# Patient Record
Sex: Female | Born: 1980
Health system: Southern US, Community
[De-identification: ages and names within clinical notes are randomized; demographics above are authoritative.]

## PROBLEM LIST (undated history)

## (undated) DIAGNOSIS — D649 Anemia, unspecified: Secondary | ICD-10-CM

## (undated) HISTORY — DX: Anemia, unspecified: D64.9

---

## 2013-08-27 ENCOUNTER — Other Ambulatory Visit (HOSPITAL_COMMUNITY)
Admission: RE | Admit: 2013-08-27 | Discharge: 2013-08-27 | Disposition: A | Discharge status: Home / Self Care | Payer: BC Managed Care – PPO | Source: Ambulatory Visit | Attending: Gynecology | Admitting: Gynecology

## 2013-08-27 ENCOUNTER — Encounter: Payer: Self-pay | Admitting: Gynecology

## 2013-08-27 ENCOUNTER — Ambulatory Visit (INDEPENDENT_AMBULATORY_CARE_PROVIDER_SITE_OTHER): Payer: BC Managed Care – PPO | Admitting: Gynecology

## 2013-08-27 VITALS — BP 120/78 | Ht 62.0 in | Wt 163.4 lb

## 2013-08-27 DIAGNOSIS — Z01419 Encounter for gynecological examination (general) (routine) without abnormal findings: Secondary | ICD-10-CM | POA: Insufficient documentation

## 2013-08-27 DIAGNOSIS — Z309 Encounter for contraceptive management, unspecified: Secondary | ICD-10-CM

## 2013-08-27 DIAGNOSIS — Z1151 Encounter for screening for human papillomavirus (HPV): Secondary | ICD-10-CM | POA: Insufficient documentation

## 2013-08-27 LAB — CBC WITH DIFFERENTIAL/PLATELET
Basophils Relative: 1 % (ref 0–1)
Eosinophils Absolute: 0.1 10*3/uL (ref 0.0–0.7)
Lymphocytes Relative: 23 % (ref 12–46)
Lymphs Abs: 2.4 10*3/uL (ref 0.7–4.0)
MCH: 26.8 pg (ref 26.0–34.0)
MCHC: 33.9 g/dL (ref 30.0–36.0)
Neutrophils Relative %: 67 % (ref 43–77)
Platelets: 394 10*3/uL (ref 150–400)
RBC: 4.66 MIL/uL (ref 3.87–5.11)
WBC: 10.4 10*3/uL (ref 4.0–10.5)

## 2013-08-27 LAB — HEMOGLOBIN A1C: Hgb A1c MFr Bld: 5.7 % — ABNORMAL HIGH (ref <5.7)

## 2013-08-27 LAB — CHOLESTEROL, TOTAL: Cholesterol: 193 mg/dL (ref 0–200)

## 2013-08-27 LAB — TSH: TSH: 1.776 u[IU]/mL (ref 0.350–4.500)

## 2013-08-27 NOTE — Patient Instructions (Addendum)
Informacin sobre el dispositivo intrauterino  (Intrauterine Device Information) El dispositivo intrauterino (DIU) se inserta en el tero e impide el embarazo. Hay dos tipos de DIU:   DIU de cobre. Este tipo de DIU est recubierto con un alambre de cobre y se inserta dentro del tero. El cobre hace que el tero y las trompas de Falopio produzcan un liquido que [employer] espermatozoides. El DIU de cobre puede Geneticist, molecular durante 10 aos.  DIU hormonal. Este tipo de DIU contiene la hormona progestina (progesterona sinttica). La hormona espesa el moco cervical y evita que los espermatozoides ingresen al tero y tambin afina la membrana que cubre el tero para evitar la implantacin del vulo fertilizado. La hormona debilita o destruye los espermatozoides que ingresan al tero. El DIU hormonal puede Geneticist, molecular durante 5 aos. El mdico se asegurar de que usted es una buena candidata para usar el DIU cono anticonceptivo. Hable con su mdico acerca de los posibles efectos secundarios.  VENTAJAS  Es muy eficaz, reversible, de accin prolongada y de bajo mantenimiento.  No hay efectos secundarios relacionados con el estrgeno.  El DIU puede ser utilizado durante la Market researcher.  No est asociado con el aumento de [place].  Funciona inmediatamente despus de la insercin.  El DIU de cobre no interfiere con las hormonas femeninas.  El DIU con progesterona puede hacer que los perodos menstruales no sean tan abundantes.  El DIU de progesterona puede usarse durante 5 aos.  El DIU de cobre puede usarse durante 10 aos. DESVENTAJAS  El DIU de progesterona puede estar asociado con patrones de sangrado irregular.  El DIU de cobre puede hacer que el flujo menstrual ms abundante y doloroso.  Puede experimentar clicos y sangrado vaginal despus de la insercin. Document Released: 05/04/2010 Document Revised: 02/06/2012 [facility] Patient Information 2014 [place],  [place].   Autoexamen de [employer]  Chief Executive Officer) El autoexamen de mamas puede detectar problemas de manera temprana, prevenir complicaciones mdicas significativas y posiblemente salvar su vida. Al hacerlo, podr familiarizarse con el aspecto y forma de sus [place], y observar cambios. Esto le permite descubrir cambios de manera precoz. Este autoexamen [employer] ofrece la tranquilidad de que sus senos estn en buen [place] de [place]. Una forma de aprender qu es normal para sus mamas y si sufren modificaciones es Radio producer un autoexamen.   Si encuentra un bulto o algo que no estaba presente anteriormente, lo mejor es ponerse en contacto con su mdico inmediatamente. Otro hallazgo que debe ser evaluado por su mdico es la secrecin del pezn, especialmente si es con sangre; cambios en la piel o enrojecimiento; reas donde la piel parece estar tironeada (retrada) o nuevos bultos o protuberancias. El dolor en los senos es rara vez se asocia con el cncer (malignidad), pero tambin debe ser evaluado por un mdico.  CMO REALIZAR EL AUTOEXAMEN DE MAMAS  El mejor momento para examinar sus mamas es a los 5 a 7 das despus de finalizado el perodo menstrual. Durante la menstruacin, las mamas estn ms abultadas y puede haber ms dificultad para Clinical research associate modificaciones. Si no menstra, ha llegado a la menopausia, o le han extirpado el tero (histerectomia), usted debe examinar sus senos a intervalos regulares, por ejemplo cada mes. Si est amamantando, examine sus senos despus de alimentar al beb o despus de usar un extractor de [place]. Los implantes mamarios no disminuyen el riesgo de bultos o tumores, por lo que debe seguir realizando el autoexamen de [employer] se recomienda.  Hable con su mdico acerca de cmo determinar la diferencia entre el implante y el tejido [place]. Adems, debe consultar cuanta presin debe hacer durante el examen. Con el tiempo se familiarizar con las variaciones de las mamas y se  sentir ms cmoda para Horticulturist, commercial. Para el autoexamen deber quitarse toda la ropa de la cintura para [place].  1.  Observe sus senos y pezones. Prese frente a un espejo en una habitacin con buena iluminacin. Con las [employer] caderas, presione las manos firmemente [place]. Busque diferencias en la forma, el contorno y el tamao de un pecho al otro (asimetras). Entre las asimetras se incluyen arrugas, depresiones o protuberancias. Tambin, busque cambios en la piel, como reas enrojecidas o escamosas. Busque cambios en los pezones, como secreciones, hoyuelos, cambios en la posicin, o enrojecimiento. 2. [place] cuidadosamente sus senos. Es mucho mejor [employer] en la ducha o en la baera, [place] jabn o cuando est recostada sobre su espalda. Coloque el brazo (en el lado de la mama que se examina) por arriba de la cabeza. Use las yemas (no las puntas) de los tres dedos centrales de la mano opuesta para palpar. Comience en la zona de la axila, haga crculos de  de pulgada (2 cm) y vaya superponindolos. Utilice 3 niveles diferentes de presin (ligero, medio y [place]) en cada crculo antes de pasar al siguiente. Se necesita una presin ligera para sentir los tejidos ms cercanos a la piel. La presin media ayudar a sentir el tejido [employer] un poco ms profundo, mientras que se necesita una presin firme para palpar el tejido que se encuentra cerca de las [place]. Continuar superponiendo crculos y vaya hacia abajo, hasta sentir las [address], por debajo del [place]. Luego mueva un espacio del ancho de un dedo hacia el centro del cuerpo. Siga con los crculos del  de pulgada (2 cm) mientras va lentamente hacia la clavcula, cerca de la base del cuello. Contine con el examen hacia arriba y hacia abajo con las 3 intensidades de presin Civil Service fast streamer a la mitad del pecho. Hgalo con cada seno cuidadosamente, buscando bultos o modificaciones. 3. Debe llevar un registro escrito con los cambios o  los hallazgos normales que encuentre para cada seno. Si registra esta informacin, no tiene que depender slo de la memoria para Designer, industrial/product, la sensibilidad o la ubicacin de los [place]. Anote en qu momento se encuentra del ciclo menstrual, si usted todava est menstruando. El tejido [employer] puede tener algunos bultos o tejidos engrosados. Sin embargo, consulte a su mdico si usted Animal nutritionist.   SOLICITE ATENCIN MDICA SI:   Observa cambios en la forma, en el contorno o el tamao de las mamas o los pezones.   Hay modificaciones en la piel, como zonas enrojecidas o escamosas en las mamas o en los pezones.   Tiene una secrecin anormal en los pezones.   Siente un nuevo bulto o reas engrosadas de [place] anormal.  Document Released: 11/14/2005 Document Revised: 10/31/2012 [facility] Patient Information 2014 [place], [place].

## 2013-08-27 NOTE — Progress Notes (Signed)
Cassandra Coffey 09-25-1981 [id number]   History:    32 y.o.  for annual gyn exam new patient to the practice who wanted to discuss contraceptive options. Patient had a child approximately 4 years ago. Patient states her last Pap smear was 5 years ago. Patient denies any prior history of abnormal Pap smears. Her 2 children were delivered vaginally at term. She's currently using condoms for contraception.  Past medical history,surgical history, family history and social history were all reviewed and documented in the EPIC chart.  Gynecologic History Patient's last menstrual period was 08/03/2013. Contraception: condoms [place] Results were: normal Last mammogram: not indicated. Results were: not indicated  Obstetric History OB History  Gravida Para Term Preterm AB SAB TAB Ectopic Multiple Living  2 2        2     # Outcome Date GA Lbr Len/2nd Weight Sex Delivery Anes PTL Lv  2 PAR           1 PAR                ROS: A ROS was performed and pertinent positives and negatives are included in the history.  GENERAL: No fevers or chills. HEENT: No change in vision, no earache, sore throat or sinus congestion. NECK: No pain or stiffness. CARDIOVASCULAR: No chest pain or pressure. No palpitations. PULMONARY: No shortness of breath, cough or wheeze. GASTROINTESTINAL: No abdominal pain, nausea, vomiting or diarrhea, melena or bright red blood per rectum. GENITOURINARY: No urinary frequency, urgency, hesitancy or dysuria. MUSCULOSKELETAL: No joint or muscle pain, no back pain, no recent trauma. DERMATOLOGIC: No rash, no itching, no lesions. ENDOCRINE: No polyuria, polydipsia, no heat or cold intolerance. No recent change in weight. HEMATOLOGICAL: No anemia or easy bruising or bleeding. NEUROLOGIC: No headache, seizures, numbness, tingling or weakness. PSYCHIATRIC: No depression, no loss of interest in normal activity or change in sleep pattern.     Exam: chaperone  present  BP 120/78  Ht 5\' 2"  (1.575 m)  Wt 163 lb 6.4 oz (74.118 kg)  BMI 29.88 kg/m2  LMP 08/03/2013  Body mass index is 29.88 kg/(m^2).  General appearance : Well developed well nourished female. No acute distress HEENT: Neck supple, trachea midline, no carotid bruits, no thyroidmegaly Lungs: Clear to auscultation, no rhonchi or wheezes, or rib retractions  Heart: Regular rate and rhythm, no murmurs or gallops Breast:Examined in sitting and supine position were symmetrical in appearance, no palpable masses or tenderness,  no skin retraction, no nipple inversion, no nipple discharge, no skin discoloration, no axillary or supraclavicular lymphadenopathy Abdomen: no palpable masses or tenderness, no rebound or guarding Extremities: no edema or skin discoloration or tenderness  Pelvic:  Bartholin, Urethra, Skene Glands: Within normal limits             Vagina: No gross lesions or discharge  Cervix: No gross lesions or discharge  Uterus  anteverted, normal size, shape and consistency, non-tender and mobile  Adnexa  Without masses or tenderness  Anus and perineum  normal   Rectovaginal  normal sphincter tone without palpated masses or tenderness             Hemoccult none indicated     Assessment/Plan:  32 y.o. female for annual exam who is interested in proceeding with the ParaGard T380A IUD or the Mirena IUD. Literature information was provided. The following labs were ordered: CBC, screening cholesterol, TSH, hemoglobin A1c, urinalysis and Pap smear. Patient had interested in  flu vaccine. The transformational breast exam was provided as well. Patient to decide if she was to proceed with the IUD and will schedule accordingly.

## 2013-08-27 NOTE — Addendum Note (Signed)
Addended by: Bertram Savin A on: 08/27/2013 12:19 PM   Modules accepted: Orders

## 2013-08-28 ENCOUNTER — Telehealth: Payer: Self-pay | Admitting: Gynecology

## 2013-08-28 LAB — URINALYSIS W MICROSCOPIC + REFLEX CULTURE
Crystals: NONE SEEN
Glucose, UA: NEGATIVE mg/dL
Nitrite: NEGATIVE
Protein, ur: NEGATIVE mg/dL
Specific Gravity, Urine: 1.005 (ref 1.005–1.030)
Squamous Epithelial / LPF: NONE SEEN
pH: 6.5 (ref 5.0–8.0)

## 2013-08-28 NOTE — Telephone Encounter (Signed)
08/28/13-PT  BC ins covers the Paraguard IUD and insertion at 100%, no copay. Debarah Crape will call pt to give her this information in Spanish//[facility]

## 2013-08-29 ENCOUNTER — Other Ambulatory Visit: Payer: Self-pay | Admitting: Gynecology

## 2013-08-29 LAB — URINE CULTURE: Colony Count: >=100000

## 2013-08-29 MED ORDER — NITROFURANTOIN MONOHYD MACRO 100 MG PO CAPS: 100.0000 mg | ORAL_CAPSULE | Freq: Two times a day (BID) | ORAL | Status: DC

## 2014-08-29 ENCOUNTER — Ambulatory Visit (INDEPENDENT_AMBULATORY_CARE_PROVIDER_SITE_OTHER): Payer: BC Managed Care – PPO | Admitting: Gynecology

## 2014-08-29 ENCOUNTER — Other Ambulatory Visit: Payer: Self-pay | Admitting: Gynecology

## 2014-08-29 ENCOUNTER — Encounter: Payer: Self-pay | Admitting: Gynecology

## 2014-08-29 ENCOUNTER — Telehealth: Payer: Self-pay | Admitting: Gynecology

## 2014-08-29 VITALS — BP 124/78 | Ht 62.5 in | Wt 160.0 lb

## 2014-08-29 DIAGNOSIS — Z01419 Encounter for gynecological examination (general) (routine) without abnormal findings: Secondary | ICD-10-CM

## 2014-08-29 DIAGNOSIS — Z30431 Encounter for routine checking of intrauterine contraceptive device: Secondary | ICD-10-CM

## 2014-08-29 NOTE — Progress Notes (Signed)
Cassandra GirtKathiana Coffey 1981-03-20 [id number]   History:    33 y.o.  gravida 2 para 2 who presented to the office today for her annual gynecological exam. Patient was seen last year for first time. She is currently using condoms for contraception. She reports normal menstrual cycles. Last year we had discussed the nonhormonal IUD ParaGard T380A IUD the patient was hesitant and is now interested. Patient with no past history of abnormal Pap smears. Patient declined flu vaccine and Tdap vaccine.   Past medical history,surgical history, family history and social history were all reviewed and documented in the EPIC chart.  Gynecologic History Patient's last menstrual period was 08/19/2014. Contraception: condoms Last Pap: 2014. Results were: normal Last mammogram: Not indicated. Results were: None indicated  Obstetric History OB History  Gravida Para Term Preterm AB SAB TAB Ectopic Multiple Living  2 2        2     # Outcome Date GA Lbr Len/2nd Weight Sex Delivery Anes PTL Lv  2 PAR           1 PAR                ROS: A ROS was performed and pertinent positives and negatives are included in the history.  GENERAL: No fevers or chills. HEENT: No change in vision, no earache, sore throat or sinus congestion. NECK: No pain or stiffness. CARDIOVASCULAR: No chest pain or pressure. No palpitations. PULMONARY: No shortness of breath, cough or wheeze. GASTROINTESTINAL: No abdominal pain, nausea, vomiting or diarrhea, melena or bright red blood per rectum. GENITOURINARY: No urinary frequency, urgency, hesitancy or dysuria. MUSCULOSKELETAL: No joint or muscle pain, no back pain, no recent trauma. DERMATOLOGIC: No rash, no itching, no lesions. ENDOCRINE: No polyuria, polydipsia, no heat or cold intolerance. No recent change in weight. HEMATOLOGICAL: No anemia or easy bruising or bleeding. NEUROLOGIC: No headache, seizures, numbness, tingling or weakness. PSYCHIATRIC: No depression, no loss of interest  in normal activity or change in sleep pattern.     Exam: chaperone present  BP 124/78  Ht 5' 2.5" (1.588 m)  Wt 160 lb (72.576 kg)  BMI 28.78 kg/m2  LMP 08/19/2014  Body mass index is 28.78 kg/(m^2).  General appearance : Well developed well nourished female. No acute distress HEENT: Neck supple, trachea midline, no carotid bruits, no thyroidmegaly Lungs: Clear to auscultation, no rhonchi or wheezes, or rib retractions  Heart: Regular rate and rhythm, no murmurs or gallops Breast:Examined in sitting and supine position were symmetrical in appearance, no palpable masses or tenderness,  no skin retraction, no nipple inversion, no nipple discharge, no skin discoloration, no axillary or supraclavicular lymphadenopathy Abdomen: no palpable masses or tenderness, no rebound or guarding Extremities: no edema or skin discoloration or tenderness  Pelvic:  Bartholin, Urethra, Skene Glands: Within normal limits             Vagina: No gross lesions or discharge  Cervix: No gross lesions or discharge  Uterus  anteverted, normal size, shape and consistency, non-tender and mobile  Adnexa  Without masses or tenderness  Anus and perineum  normal   Rectovaginal  normal sphincter tone without palpated masses or tenderness             Hemoccult none indicated     Assessment/Plan:  33 y.o. female for annual exam who is now interested in the ParaGard T3 IUD for contraception. Literature information in Spanish was provided. She will schedule accordingly after checking with insurance. Pap smear  not done today in accordance to the new guidelines. The following labs were ordered and a fasting state today: Fasting lipid profile, copious metabolic panel, CBC, TSH and urinalysis. Patient declined flu vaccine and Tdap vaccine. We discussed importance of calcium and vitamin D in regular exercise for osteoporosis prevention. We discussed importance of monthly breast exams.   Ok Edwards MD, 11:33 AM  08/29/2014

## 2014-08-29 NOTE — Telephone Encounter (Signed)
08/29/14-[facility]ard [facility]. It is still covered at 100%.[facility]

## 2014-08-29 NOTE — Patient Instructions (Signed)
Informacin sobre el dispositivo intrauterino (Intrauterine Device Information) Un dispositivo intrauterino (DIU) se inserta en el tero e impide el embarazo. Hay dos tipos de DIU:   DIU de cobre: este tipo de DIU est recubierto con un alambre de cobre y se inserta dentro del tero. El cobre hace que el tero y las trompas de Falopio produzcan un liquido que destruye los espermatozoides. El DIU de cobre puede permanecer en el lugar durante 10 aos.  DIU con hormona: este tipo de DIU contiene la hormona progestina (progesterona sinttica). Las hormonas hacen que el moco cervical se haga ms espeso, lo que evita que el esperma ingrese al tero. Tambin hace que la membrana que recubre internamente al tero sea ms delgada lo que impide el implante del vulo fertilizado. La hormona debilita o destruye los espermatozoides que ingresan al tero. Alguno de los tipos de DIU hormonal pueden permanecer en el lugar durante 5 aos y otros tipos pueden dejarse en el lugar por 3 aos. El mdico se asegurar de que usted sea una buena candidata para usar el DIU. Converse con su mdico acerca de los posibles efectos secundarios.  VENTAJAS DEL DISPOSITIVO INTRAUTERINO  El DIU es muy eficaz, reversible, de accin prolongada y de bajo mantenimiento.  No hay efectos secundarios relacionados con el estrgeno.  El DIU puede ser utilizado durante la lactancia.  No est asociado con el aumento de peso.  Funciona inmediatamente despus de la insercin.  El DIU hormonal funciona inmediatamente si se inserta dentro de los 7 das del inicio del perodo. Ser necesario que utilice un mtodo anticonceptivo adicional durante 7 das si el DIU hormonal se inserta en algn otro momento del ciclo.  El DIU de cobre no interfiere con las hormonas femeninas.  El DIU hormonal puede hacer que los perodos menstruales abundantes se hagan ms ligeros y que haya menos clicos.  El DIU hormonal puede usarse durante 3 a 5  aos.  El DIU de cobre puede usarse durante 10 aos. DESVENTAJAS DEL DISPOSITIVO INTRAUTERINO  El DIU hormonal puede estar asociado con patrones de sangrado irregular.  El DIU de cobre puede hacer que el flujo menstrual ms abundante y doloroso.  Puede experimentar clicos y sangrado vaginal despus de la insercin. Document Released: 05/04/2010 Document Revised: 07/17/2013 [employer] Patient Information 2015 [employer]. This information is not intended to replace advice given to you by your health care provider. Make sure you discuss any questions you have with your health care provider.  

## 2014-08-30 LAB — CBC WITH DIFFERENTIAL/PLATELET
Basophils Absolute: 0 10*3/uL (ref 0.0–0.1)
Basophils Relative: 0 % (ref 0–1)
EOS PCT: 2 % (ref 0–5)
Eosinophils Absolute: 0.2 10*3/uL (ref 0.0–0.7)
HEMATOCRIT: 40.3 % (ref 36.0–46.0)
HEMOGLOBIN: 13.2 g/dL (ref 12.0–15.0)
LYMPHS PCT: 23 % (ref 12–46)
Lymphs Abs: 2.6 10*3/uL (ref 0.7–4.0)
MCH: 26.9 pg (ref 26.0–34.0)
MCHC: 32.8 g/dL (ref 30.0–36.0)
MCV: 82.1 fL (ref 78.0–100.0)
MONO ABS: 0.8 10*3/uL (ref 0.1–1.0)
MONOS PCT: 7 % (ref 3–12)
NEUTROS ABS: 7.8 10*3/uL — AB (ref 1.7–7.7)
Neutrophils Relative %: 68 % (ref 43–77)
Platelets: 418 10*3/uL — ABNORMAL HIGH (ref 150–400)
RBC: 4.91 MIL/uL (ref 3.87–5.11)
RDW: 13.5 % (ref 11.5–15.5)
WBC: 11.5 10*3/uL — ABNORMAL HIGH (ref 4.0–10.5)

## 2014-08-30 LAB — LIPID PANEL
CHOLESTEROL: 201 mg/dL — AB (ref 0–200)
HDL: 33 mg/dL — AB (ref >39)
LDL Cholesterol: 129 mg/dL — ABNORMAL HIGH (ref 0–99)
Total CHOL/HDL Ratio: 6.1 Ratio
Triglycerides: 194 mg/dL — ABNORMAL HIGH (ref <150)
VLDL: 39 mg/dL (ref 0–40)

## 2014-08-30 LAB — TSH: TSH: 1.489 u[IU]/mL (ref 0.350–4.500)

## 2014-08-30 LAB — URINALYSIS W MICROSCOPIC + REFLEX CULTURE
Bacteria, UA: NONE SEEN
Bilirubin Urine: NEGATIVE
CASTS: NONE SEEN
Crystals: NONE SEEN
Glucose, UA: NEGATIVE mg/dL
HGB URINE DIPSTICK: NEGATIVE
Ketones, ur: NEGATIVE mg/dL
LEUKOCYTES UA: NEGATIVE
Nitrite: NEGATIVE
PH: 6 (ref 5.0–8.0)
Protein, ur: NEGATIVE mg/dL
SPECIFIC GRAVITY, URINE: 1.025 (ref 1.005–1.030)
Urobilinogen, UA: 0.2 mg/dL (ref 0.0–1.0)

## 2014-08-30 LAB — COMPREHENSIVE METABOLIC PANEL
ALBUMIN: 4.5 g/dL (ref 3.5–5.2)
ALT: 14 U/L (ref 0–35)
AST: 13 U/L (ref 0–37)
Alkaline Phosphatase: 58 U/L (ref 39–117)
BUN: 11 mg/dL (ref 6–23)
CHLORIDE: 100 meq/L (ref 96–112)
CO2: 26 mEq/L (ref 19–32)
Calcium: 9.6 mg/dL (ref 8.4–10.5)
Creat: 0.77 mg/dL (ref 0.50–1.10)
GLUCOSE: 76 mg/dL (ref 70–99)
Potassium: 4.3 mEq/L (ref 3.5–5.3)
Sodium: 135 mEq/L (ref 135–145)
Total Bilirubin: 0.4 mg/dL (ref 0.2–1.2)
Total Protein: 7.5 g/dL (ref 6.0–8.3)

## 2014-09-01 ENCOUNTER — Other Ambulatory Visit: Payer: Self-pay | Admitting: Gynecology

## 2014-09-01 DIAGNOSIS — E7800 Pure hypercholesterolemia, unspecified: Secondary | ICD-10-CM

## 2014-09-01 DIAGNOSIS — D72829 Elevated white blood cell count, unspecified: Secondary | ICD-10-CM

## 2014-09-29 ENCOUNTER — Encounter: Payer: Self-pay | Admitting: Gynecology

## 2015-05-04 ENCOUNTER — Encounter: Payer: Self-pay | Admitting: Gynecology

## 2015-05-04 ENCOUNTER — Ambulatory Visit (INDEPENDENT_AMBULATORY_CARE_PROVIDER_SITE_OTHER): Payer: BLUE CROSS/BLUE SHIELD | Admitting: Gynecology

## 2015-05-04 VITALS — BP 116/70

## 2015-05-04 DIAGNOSIS — N644 Mastodynia: Secondary | ICD-10-CM | POA: Diagnosis not present

## 2015-05-04 DIAGNOSIS — N62 Hypertrophy of breast: Secondary | ICD-10-CM

## 2015-05-04 NOTE — Progress Notes (Signed)
   Patient is a 34 year old who presented to the office today complaining that over the past year she's had pressure sensation on the bottom of her left breast. She breast-fed both of her children. She is currently having normal menstrual cycle. She is using condoms for contraception. Patient is on a direct breast cancer family history is one aunt at the age of 33 and another aunt had lung cancer. She denies palpating any masses or any recent injury or trauma or any nipple discharge. She is interested in returning back during her next menstrual cycle to place the ParaGard T380A IUD for contraception. Patient does not want to have anymore children but her husband is still undecided.  Exam: Blood pressure 116/70 Gen. appearance well-developed well-nourished female in no acute distress Breast exam: Both breasts were examined sitting supine position both breasts were pendulous and large the left breast larger than the right. There was no nipple inversion. No skin dimpling. No supraclavicular axillary lymphadenopathy no palpable masses.  Assessment/plan: Patient is breast sensitivity and tenderness underneath her breasts probably attributed to the pendulous breasts. We discussed different types of breath support as she may want to utilize. I'm going to refer her also to a plastic surgeon for consideration of reduction mammoplasty. Also recommended she take vitamin D 600 units daily. Patient to return at the start of her next menses to place a ParaGard IUD.

## 2015-05-04 NOTE — Patient Instructions (Addendum)
Reduccin mamaria  (Breast Reduction) La reduccin mamaria (tambin llamada mamoplasta reductora) es un procedimiento quirrgico para reducir el tamao de las [place]. Para ello, el cirujano elimina grasa, tejido y el exceso de piel. Hay algunas razones frecuentes para Technical brewer. En [employer], los pechos grandes contribuyen a la mala postura o dolores crnicos de cuello y espalda. A veces, aparece una erupcin debajo de los senos. Los senos grandes tambin pueden hacer ms difcil la actividad fsica. Antes del procedimiento, usted y su cirujano decidirn sobre el nuevo tamao y forma de sus senos El procedimiento demora entre 1 a 2 horas.  Despus de la reduccin mamaria, sus senos sern ms pequeos. Esto facilitar los movimientos.  INFORME A SU MDICO:  Cualquier alergia que tenga.  Todos los [employer] [place], incluyendo vitaminas, hierbas, gotas oftlmicas, cremas y [address] de [address].  Problemas previos que usted o los [employer] de su familia hayan tenido con el uso de anestsicos.  Enfermedades de [employer].  Cirugas previas.  Padecimientos mdicos.  Resfros o episodios recientes de fiebre. RIESGOS Y COMPLICACIONES Generalmente es un procedimiento seguro. Sin embargo, Tree surgeon procedimiento, pueden surgir complicaciones. Las complicaciones posibles son:  Sharlyne Pacas.  Cogulos de [employer] piernas o los pulmones.  Acumulacin de sangre cerca de la herida (hematoma). La causa es un vaso sanguneo que se ha roto.  Infeccin.  Prdida total o parcial de la sensibilidad en los senos o pezones. Hable de esto con su cirujano antes de la [place].  Daos en el rea oscura que rodea el pezn (areola).  Prdida de la capacidad de Museum/gallery exhibitions officer.  Neumona. Este es un riesgo comn a Arboriculturist. ANTES DEL PROCEDIMIENTO  Consulte a su mdico si debe cambiar o suspender los medicamentos que toma habitualmente. Asegrese de  saber cundo dejar de tomar los anti-inflamatorios no esteroides (AINE), la vitamina E y los suplementos a base de hierbas Pueden aumentar el riesgo de sangrado durante la [place].  No coma ni beba nada durante al menos 8 horas antes del procedimiento. Consulte con su mdico si puede tomar los [employer] necesita con un sorbo de [place].  Deje de fumar. El fumar dificulta la curacin. Tambin favorece la formacin de cicatrices.  Si tiene ms de 35 aos, el [place] le har una mamografa antes de la [place].  Le indicarn que se bae o se lave con un jabn antibacterial antes del procedimiento.  [place] a alguna persona que la lleve a su casa. PROCEDIMIENTO  Se le colocar una sonda intravenosa en una de las venas. Le administrarn un medicamento que la har dormir (anestesia general).  Le harn unos cortes incisiones) en las mamas.  En primer lugar, el cirujano extirpar grasa, tejido y exceso de piel. Luego volver a darle forma a las mamas. Podr extirparle los pezones y luego volverlos a Scientific laboratory technician, cuando Web designer.  Cerrar las incisiones con puntos de sutura.  El cirujano colocar pequeos tubos bajo la piel por [address], para que la herida drene. Le ensear cmo cuidar los drenajes.  Al final de la ciruga, vendar las [place] con gasa y vendajes elsticos. Esto las prote[employer] primeros das y Biochemist, clinical curacin. DESPUS DEL PROCEDIMIENTO  La trasladarn a una sala de recuperacin. All controlarn su recuperacin. Se controlar la temperatura, la presin arterial, la frecuencia cardaca y respiratoria (signos vitales).  Si la [employer], podr volver a su casa cuando se encuentre estable y pueda beber lquidos. Si  fue una ciruga con internacin, la llevarn a su habitacin.  Le darn un sostn especial para que use durante el proceso de curacin. Document Released: 03/11/2013 Document Revised: 11/19/2013 [facility] LPExitCare Patient Information  2015 [place]Care, [place]LL[place]. This information is not intended to replace advice given to you by your health care provider. Make sure you discuss any questions you have with your health care provider. Informacin sobre el dispositivo intrauterino (Intrauterine Device Information) Un dispositivo intrauterino (DIU) se inserta en el tero e impide el embarazo. Hay dos tipos de DIU:   DIU de cobre: este tipo de DIU est recubierto con un alambre de cobre y se inserta dentro del tero. El cobre hace que el tero y las trompas de [place] produzcan un liquido que Federated Department Storesdestruye los espermatozoides. El DIU de cobre puede Geneticist, molecularpermanecer en el lugar durante 10 aos.  DIU con hormona: este tipo de DIU contiene la hormona progestina (progesterona sinttica). Las hormonas hacen que el moco cervical se haga ms espeso, lo que evita que el esperma ingrese al tero. Tambin hace que la membrana que recubre internamente al tero sea ms delgada lo que impide el implante del vulo fertilizado. La hormona debilita o destruye los espermatozoides que ingresan al tero. Alguno de los tipos de DIU hormonal pueden Geneticist, molecularpermanecer en el lugar durante 5 aos y otros tipos pueden dejarse en el lugar por 3 aos. El mdico se asegurar de que usted sea una buena candidata para usar el DIU. Converse con su mdico acerca de los posibles efectos secundarios.  VENTAJAS DEL DISPOSITIVO INTRAUTERINO  El DIU es muy eficaz, reversible, de accin prolongada y de bajo mantenimiento.  No hay efectos secundarios relacionados con el estrgeno.  El DIU puede ser utilizado durante la Market researcherlactancia.  No est asociado con el aumento de Garrisonpeso.  Funciona inmediatamente despus de la insercin.  El DIU hormonal funciona inmediatamente si se inserta dentro de los [address] das del inicio del perodo. Ser necesario que utilice un mtodo anticonceptivo adicional durante 7 das si el DIU hormonal se inserta en algn otro momento del ciclo.  El DIU de cobre no interfiere con las  hormonas femeninas.  El DIU hormonal puede hacer que los perodos menstruales abundantes se hagan ms ligeros y que haya menos clicos.  El DIU hormonal puede usarse durante 3 a 5 aos.  El DIU de cobre puede usarse durante 10 aos. DESVENTAJAS DEL DISPOSITIVO INTRAUTERINO  El DIU hormonal puede estar asociado con patrones de sangrado irregular.  El DIU de cobre puede hacer que el flujo menstrual ms abundante y doloroso.  Puede experimentar clicos y sangrado vaginal despus de la insercin. Document Released: 05/04/2010 Document Revised: 07/17/2013 [facility] ValleyExitCare Patient Information 2015 [place]ExitCare, MarylandLLC. This information is not intended to replace advice given to you by your health care provider. Make sure you discuss any questions you have with your health care provider.

## 2015-05-05 ENCOUNTER — Telehealth: Payer: Self-pay | Admitting: Gynecology

## 2015-05-05 NOTE — Telephone Encounter (Signed)
05/05/15-Pt again requested Paraguard Benefits. [facility] it is still covered at 100%, no copay or deductible, for contraception.wl

## 2015-09-03 ENCOUNTER — Telehealth: Payer: Self-pay | Admitting: Gynecology

## 2015-09-03 ENCOUNTER — Ambulatory Visit (INDEPENDENT_AMBULATORY_CARE_PROVIDER_SITE_OTHER): Payer: BLUE CROSS/BLUE SHIELD | Admitting: Gynecology

## 2015-09-03 ENCOUNTER — Encounter: Payer: Self-pay | Admitting: Gynecology

## 2015-09-03 VITALS — BP 120/82 | Ht 62.5 in | Wt 164.0 lb

## 2015-09-03 DIAGNOSIS — Z01419 Encounter for gynecological examination (general) (routine) without abnormal findings: Secondary | ICD-10-CM | POA: Diagnosis not present

## 2015-09-03 NOTE — Telephone Encounter (Signed)
09/03/15-I asked Cassandra Coffey to call this pt to tell her in Spanish that her [facility] will cover the Paraguard for contraception at 100%. She already has an appt set up with JF.wl

## 2015-09-03 NOTE — Progress Notes (Signed)
Cassandra Coffey 1981-10-13 [id number]   History:    34 y.o.  for annual gyn exam with no complaints today who was scheduled to return back next week for placement of the ParaGard T380A IUD. Patient is using barrier contraception for now. Patient reports normal menstrual cycles. Patient not interested in flu vaccine today. No past history of abnormal Pap smears  Past medical history,surgical history, family history and social history were all reviewed and documented in the EPIC chart.  Gynecologic History Patient's last menstrual period was 08/27/2015. Contraception: condoms Last Pap: 2014. Results were: normal Last mammogram: Not indicated. Results were: Not indicated  Obstetric History OB History  Gravida Para Term Preterm AB SAB TAB Ectopic Multiple Living  # Outcome Date GA Lbr Len/2nd Weight Sex Delivery Anes PTL Lv  2 Para           1 Para                ROS: A ROS was performed and pertinent positives and negatives are included in the history.  GENERAL: No fevers or chills. HEENT: No change in vision, no earache, sore throat or sinus congestion. NECK: No pain or stiffness. CARDIOVASCULAR: No chest pain or pressure. No palpitations. PULMONARY: No shortness of breath, cough or wheeze. GASTROINTESTINAL: No abdominal pain, nausea, vomiting or diarrhea, melena or bright red blood per rectum. GENITOURINARY: No urinary frequency, urgency, hesitancy or dysuria. MUSCULOSKELETAL: No joint or muscle pain, no back pain, no recent trauma. DERMATOLOGIC: No rash, no itching, no lesions. ENDOCRINE: No polyuria, polydipsia, no heat or cold intolerance. No recent change in weight. HEMATOLOGICAL: No anemia or easy bruising or bleeding. NEUROLOGIC: No headache, seizures, numbness, tingling or weakness. PSYCHIATRIC: No depression, no loss of interest in normal activity or change in sleep pattern.     Exam: chaperone present  BP 120/82 mmHg  Ht 5' 2.5" (1.588 m)  Wt 164  lb (74.39 kg)  BMI 29.50 kg/m2  LMP 08/27/2015  Body mass index is 29.5 kg/(m^2).  General appearance : Well developed well nourished female. No acute distress HEENT: Eyes: no retinal hemorrhage or exudates,  Neck supple, trachea midline, no carotid bruits, no thyroidmegaly Lungs: Clear to auscultation, no rhonchi or wheezes, or rib retractions  Heart: Regular rate and rhythm, no murmurs or gallops Breast:Examined in sitting and supine position were symmetrical in appearance, no palpable masses or tenderness,  no skin retraction, no nipple inversion, no nipple discharge, no skin discoloration, no axillary or supraclavicular lymphadenopathy Abdomen: no palpable masses or tenderness, no rebound or guarding Extremities: no edema or skin discoloration or tenderness  Pelvic:  Bartholin, Urethra, Skene Glands: Within normal limits             Vagina: No gross lesions or discharge  Cervix: No gross lesions or discharge  Uterus  anteverted, normal size, shape and consistency, non-tender and mobile  Adnexa  Without masses or tenderness  Anus and perineum  normal   Rectovaginal  normal sphincter tone without palpated masses or tenderness             Hemoccult not indicated     Assessment/Plan:  34 y.o. female for annual exam doing well otherwise will return back next week in a fasting state for the following screening blood work at time of placement of the [employer] T380A IUD: Comprehensive metabolic panel, fasting lipid profile, TSH, CBC, and urinalysis. Pap smear not done  today according to the new guidelines. Patient refuses flu vaccine.   Ok Edwards MD, 12:44 PM 09/03/2015

## 2015-09-08 ENCOUNTER — Other Ambulatory Visit: Payer: BLUE CROSS/BLUE SHIELD

## 2015-09-08 DIAGNOSIS — Z01419 Encounter for gynecological examination (general) (routine) without abnormal findings: Secondary | ICD-10-CM

## 2015-09-08 LAB — COMPREHENSIVE METABOLIC PANEL
ALBUMIN: 4.5 g/dL (ref 3.6–5.1)
ALT: 18 U/L (ref 6–29)
AST: 14 U/L (ref 10–30)
Alkaline Phosphatase: 58 U/L (ref 33–115)
BILIRUBIN TOTAL: 0.4 mg/dL (ref 0.2–1.2)
BUN: 8 mg/dL (ref 7–25)
CHLORIDE: 103 mmol/L (ref 98–110)
CO2: 25 mmol/L (ref 20–31)
CREATININE: 0.73 mg/dL (ref 0.50–1.10)
Calcium: 9.4 mg/dL (ref 8.6–10.2)
Glucose, Bld: 90 mg/dL (ref 65–99)
Potassium: 4.3 mmol/L (ref 3.5–5.3)
SODIUM: 136 mmol/L (ref 135–146)
TOTAL PROTEIN: 7.2 g/dL (ref 6.1–8.1)

## 2015-09-08 LAB — LIPID PANEL
CHOLESTEROL: 159 mg/dL (ref 125–200)
HDL: 25 mg/dL — ABNORMAL LOW (ref >=46)
LDL Cholesterol: 94 mg/dL (ref <130)
TRIGLYCERIDES: 199 mg/dL — AB (ref <150)
Total CHOL/HDL Ratio: 6.4 Ratio — ABNORMAL HIGH (ref <=5.0)
VLDL: 40 mg/dL — AB (ref <30)

## 2015-09-08 LAB — TSH: TSH: 1.803 u[IU]/mL (ref 0.350–4.500)

## 2015-09-09 LAB — CBC WITH DIFFERENTIAL/PLATELET
BASOS ABS: 0 10*3/uL (ref 0.0–0.1)
BASOS PCT: 0 % (ref 0–1)
EOS ABS: 0.2 10*3/uL (ref 0.0–0.7)
Eosinophils Relative: 2 % (ref 0–5)
HCT: 39.1 % (ref 36.0–46.0)
HEMOGLOBIN: 12.8 g/dL (ref 12.0–15.0)
Lymphocytes Relative: 23 % (ref 12–46)
Lymphs Abs: 2.7 10*3/uL (ref 0.7–4.0)
MCH: 26.7 pg (ref 26.0–34.0)
MCHC: 32.7 g/dL (ref 30.0–36.0)
MCV: 81.6 fL (ref 78.0–100.0)
MONOS PCT: 8 % (ref 3–12)
MPV: 9.8 fL (ref 8.6–12.4)
Monocytes Absolute: 1 10*3/uL (ref 0.1–1.0)
NEUTROS ABS: 8 10*3/uL — AB (ref 1.7–7.7)
NEUTROS PCT: 67 % (ref 43–77)
PLATELETS: 402 10*3/uL — AB (ref 150–400)
RBC: 4.79 MIL/uL (ref 3.87–5.11)
RDW: 13.3 % (ref 11.5–15.5)
WBC: 11.9 10*3/uL — AB (ref 4.0–10.5)

## 2015-09-09 LAB — URINALYSIS W MICROSCOPIC + REFLEX CULTURE
BACTERIA UA: NONE SEEN [HPF]
BILIRUBIN URINE: NEGATIVE
CRYSTALS: NONE SEEN [HPF]
Casts: NONE SEEN [LPF]
GLUCOSE, UA: NEGATIVE
Ketones, ur: NEGATIVE
LEUKOCYTES UA: NEGATIVE
Nitrite: NEGATIVE
PROTEIN: NEGATIVE
Specific Gravity, Urine: 1.012 (ref 1.001–1.035)
WBC UA: NONE SEEN WBC/HPF (ref <=5)
YEAST: NONE SEEN [HPF]
pH: 6 (ref 5.0–8.0)

## 2015-09-10 ENCOUNTER — Ambulatory Visit (INDEPENDENT_AMBULATORY_CARE_PROVIDER_SITE_OTHER): Payer: BLUE CROSS/BLUE SHIELD | Admitting: Gynecology

## 2015-09-10 ENCOUNTER — Encounter: Payer: Self-pay | Admitting: Gynecology

## 2015-09-10 VITALS — BP 124/78

## 2015-09-10 DIAGNOSIS — Z30430 Encounter for insertion of intrauterine contraceptive device: Secondary | ICD-10-CM | POA: Diagnosis not present

## 2015-09-10 DIAGNOSIS — Z975 Presence of (intrauterine) contraceptive device: Secondary | ICD-10-CM | POA: Insufficient documentation

## 2015-09-10 LAB — URINE CULTURE: Colony Count: 90000

## 2015-09-10 NOTE — Progress Notes (Signed)
   Patient is a 34 year old who was seen in the office on October 6 of this year for annual exam and was interested in a nonhormonal IUD. Patient's last menstrual cycle was less than 10 days ago but has not had any intercourse since her last menstrual period. She is here for placement of the ParaGard T380A IUD for which literature information at present been provided.review of patient's lab work done fasting a few days ago demonstrated her triglycerides were elevated at 199 and her VLDL was elevated at 40.                                                                    IUD procedure note       Patient presented to the office today for placement of ParaGard T380A IUD. The patient had previously been provided with literature information on this method of contraception. The risks benefits and pros and cons were discussed and all her questions were answered. She is fully aware that this form of contraception is 99% effective and is good for 5 years.  Pelvic exam: Bartholin urethra Skene glands: Within normal limits Vagina: No lesions or discharge Cervix: No lesions or discharge Uterus: anteverted position Adnexa: No masses or tenderness Rectal exam: Not done  The cervix was cleansed with Betadine solution. A single-tooth tenaculum was placed on the anterior cervical lip. The uterus sounded to 7 centimeter. The IUD was shown to the patient and inserted in a sterile fashion. The IUD string was trimmed. The single-tooth tenaculum was removed. Patient was instructed to return back to the office in one month for follow up.    ParaGard T380A IUD placed 09/10/2015 good for 10 years. Lot #[id number]  Patient was provided with literature information on cholesterol lowering diet as well as on exercise. She was instructed to return back in one month for IUD check and in 6 months for fasting lipid profile.

## 2015-09-10 NOTE — Patient Instructions (Addendum)
Control del colesterol  Los niveles de colesterol en el organismo estn determinados significativamente por su dieta. Los niveles de colesterol tambin se relacionan con la enfermedad cardaca. El material que sigue ayuda a explicar esta relacin y a analizar qu puede hacer para mantener su corazn sano. No todo el colesterol es malo. Las lipoprotenas de baja densidad (LDL) forman el colesterol "malo". El colesterol malo puede ocasionar depsitos de grasa que se acumulan en el interior de las arterias. Las lipoprotenas de alta densidad (HDL) es el colesterol "bueno". Ayuda a remover el colesterol LDL "malo" de la sangre. El colesterol es un factor de riesgo muy importante para la enfermedad cardaca. Otros factores de riesgo son la hipertensin arterial, el hbito de fumar, el estrs, la herencia y el peso.   El msculo cardaco obtiene el suministro de sangre a travs de las arterias coronarias. Si su colesterol LDL ("malo") est elevado y el HDL ("bueno") es bajo, tiene un factor de riesgo para que se formen depsitos de grasa en las arterias coronarias (los vasos sanguneos que suministran sangre al corazn). Esto hace que haya menos lugar para que la sangre circule. Sin la suficiente sangre y oxgeno, el msculo cardaco no puede funcionar correctamente, y usted podr sentir dolores en el pecho (angina pectoris). Cuando una arteria coronaria se cierra completamente, una parte del msculo cardaco puede morir (infarto de miocardio).  CONTROL DEL COLESTEROL Cuando el profesional que lo asiste enva la sangre al laboratorio para conocer el nivel de colesterol, puede realizarle tambin un perfil completo de los lpidos. Con esta prueba, se puede determinar la cantidad total de colesterol, as como los niveles de LDL y HDL. Los triglicridos son un tipo de grasa que circula en la sangre y que tambin puede utilizarse para determinar el riesgo de enfermedad  cardaca. En la siguiente tabla se establecen los nmeros ideales: Prueba: Colesterol total  Menos de 200 mg/dl.  Prueba: LDL "colesterol malo"  Menos de 100 mg/dl.   Menos de 70 mg/dl si tiene riesgo muy elevado de sufrir un ataque cardaco o muerte cardaca sbita.  Prueba: HDL "colesterol bueno"  Mujeres: Ms de 50 mg/dl.   Hombres: Ms de 40 mg/dl.  Prueba: Trigliceridos  Menos de 150 mg/dl.    CONTROL DEL COLESTEROL CON DIETA Aunque factores como el ejercicio y el estilo de vida son importantes, la "primera lnea de ataque" es la dieta. Esto se debe a que se sabe que ciertos alimentos hacen subir el colesterol y otros lo bajan. El objetivo debe ser equilibrar los alimentos, de modo que tengan un efecto sobre el colesterol y, an ms importante, reemplazar las grasas saturadas y trans con otros tipos de grasas, como las monoinsaturadas y las poliinsaturadas y cidos grasos omega-3 . En promedio, una persona no debe consumir ms de 15 a 17 g de grasas saturadas por da. Las grasas saturadas y trans se consideran grasas "malas", ya que elevan el colesterol LDL. Las grasas saturadas se encuentran principalmente en productos animales como carne, manteca y crema. Pero esto no significa que usted debe sacrificar todas sus comidas favoritas. Actualmente, como lo muestra el cuadro que figura al final de este documento, hay sustitutos de buen sabor, bajos en grasas y en colesterol, para la mayora de los alimentos que a usted le gusta comer. Elija aquellos alimentos alternativos que sean bajos en grasas o sin grasas. Elija cortes de carne del cuarto trasero o lomo ya que estos cortes son los que tienen menor cantidad de grasa   y colesterol. El pollo (sin piel), el pescado, la carne de ternera, y la pechuga de pavo molida son excelentes opciones. Elimine las carnes grasosas como los hotdogs o el salami. Los mariscos tienen poco o nada de grasas saturadas. Cuando consuma carne magra, carne de aves de  corral, o pescado, hgalo en porciones de 85 gramos (3 onzas). Las grasas trans tambin se llaman "aceites parcialmente hidrogenados". Son aceites manipulados cientficamente de modo que son slidos a temperatura ambiente, tienen una larga vida y mejoran el sabor y la textura de los alimentos a los que se agregan. Las grasas trans se encuentran en la margarina, masitas, crackers y alimentos horneados.  Para hornear y cocinar, el aceite es un excelente sustituto para la mantequilla. Los aceites monoinsaturados tienen un beneficio particular, ya que se cree que disminuyen el colesterol LDL (colesterol malo) y elevan el HDL. Deber evitar los aceites tropicales saturados como el de coco y el de palma.  Recuerde, adems, que puede comer sin restricciones los grupos de alimentos que son naturalmente libres de grasas saturadas y grasas trans, entre los que se incluyen el pescado, las frutas (excepto el aguacate), verduras, frijoles, cereales (cebada, arroz, cuzcuz, trigo) y las pastas (sin salsas con crema)   IDENTIFIQUE LOS ALIMENTOS QUE DISMINUYEN EL COLESTEROL  Pueden disminuir el colesterol las fibras solubles que estn en las frutas, como las manzanas, en los vegetales como el brcoli, las patatas y las zanahorias; en las legumbres como frijoles, guisantes y lentejas; y en los cereales como la cebada. Los alimentos fortificados con fitosteroles tambin pueden disminuir el colesterol. Debe consumir al menos 2 g de estos alimentos a diario para obtener el efecto de disminucin de colesterol.  En el supermercado, lea las etiquetas de los envases para identificar los alimentos bajos en grasas saturadas, libres de grasas trans y bajos en grasas, . Elija quesos que tengan solo de 2 a 3 g de grasa saturada por onza (28,35 g). Use una margarina que no dae el corazn, libre de grasas trans o aceite parcialmente hidrogenado. Al comprar alimentos horneados (galletitas dulces y galletas) evite el aceite parcialmente  hidrogenado. Los panes y bollos debern ser de granos enteros (harina de maz o de avena entera, en lugar de "harina" o "harina enriquecida"). Compre sopas en lata que no sean cremosas, con bajo contenido de sal y sin grasas adicionadas.   TCNICAS DE PREPARACIN DE LOS ALIMENTOS  Nunca fra los alimentos en aceite abundante. Si debe frer, hgalo en poco aceite y removiendo constantemente, porque as se utilizan muy pocas grasas, o utilice un spray antiadherente. Cuando le sea posible, hierva, hornee o ase las carnes y cocine los vegetales al vapor. En vez de aderezar los vegetales con mantequilla o margarina, utilice limn y hierbas, pur de manzanas y canela (para las calabazas y batatas), yogurt y salsa descremados y aderezos para ensaladas bajos en contenido graso.   BAJO EN GRASAS SATURADAS / SUSTITUTOS BAJOS EN GRASA  Carnes / Grasas saturadas (g)  Evite: Bife, corte graso (3 oz/85 g) / 11 g   Elija: Bife, corte magro (3 oz/85 g) / 4 g   Evite: Hamburguesa (3 oz/85 g) / 7 g   Elija:  [place] magra (3 oz/85 g) / 5 g   Evite: Jamn (3 oz/85 g) / 6 g   Elija:  Jamn magro (3 oz/85 g) / 2.4 g   Evite: Pollo, con piel (3 oz/85 g), Carne oscura / 4 g   Elija:  Pollo, sin piel (  3 oz/85 g), Carne oscura / 2 g   Evite: Pollo, con piel (3 oz/85 g), Carne magra / 2.5 g   Elija: Pollo, sin piel (3 oz/85 g), Carne magra / 1 g  Lcteos / Grasas saturadas (g)  Evite: Leche entera (1 taza) / 5 g   Elija: Leche con bajo contenido de grasa, 2% (1 taza) / 3 g   Elija: Leche con bajo contenido de grasa, 1% (1 taza) / 1.5 g   Elija: Leche descremada (1 taza) / 0.3 g   Evite: Queso duro (1 oz/28 g) / 6 g   Elija: Queso descremado (1 oz/28 g) / 2-3 g   Evite: Queso cottage, 4% grasa (1 taza)/ 6.5 g   Elija: Queso cottage con bajo contenido de grasa, 1% grasa (1 taza)/ 1.5 g   Evite: Helado (1 taza) / 9 g   Elija: Sorbete (1 taza) / 2.5 g   Elija: Yogurt helado sin contenido de  grasa (1 taza) / 0.3 g   Elija: Barras de fruta congeladas / vestigios   Evite: Crema batida (1 cucharada) / 3.5 g   Elija: Batidos glac sin lcteos (1 cucharada) / 1 g  Condimentos / Grasas saturadas (g)  Evite: Mayonesa (1 cucharada) / 2 g   Elija: Mayonesa con bajo contenido de grasa (1 cucharada) / 1 g   Evite: Manteca (1 cucharada) / 7 g   Elija: Margarina extra light (1 cucharada) / 1 g   Evite: Aceite de coco (1 cucharada) / 11.8 g   Elija: Aceite de oliva (1 cucharada) / 1.8 g   Elija: Aceite de maz (1 cucharada) / 1.7 g   Elija: Aceite de crtamo (1 cucharada) / 1.2 g   Elija: Aceite de girasol (1 cucharada) / 1.4 g   Elija: Aceite de soja (1 cucharada) / 2.4 g   Elija: Aceite de canola (1 cucharada) / 1 g  Document Released: 11/14/2005 Document Revised: 07/27/2011 ExitCare Patient Information 2012 [employer]. Ejercicios para perder peso (Exercise to Lose Weight) La actividad fsica y una dieta saludable ayudan a perder peso. El mdico podr sugerirle ejercicios especficos. IDEAS Y CONSEJOS PARA HACER EJERCICIOS  Elija opciones econmicas que disfrute hacer , como caminar, andar en bicicleta o los vdeos para ejercitarse.   Utilice las escaleras en lugar del ascensor.   Camine durante la hora del almuerzo.   Estacione el auto lejos del lugar de trabajo o estudio.   Concurra a un gimnasio o tome clases de gimnasia.   Comience con 5  10 minutos de actividad fsica por da. Ejercite hasta 30 minutos, 4 a 6 das por semana.   Utilice zapatos que tengan un buen soporte y ropas cmodas.   Elongue antes y despus de ejercitar.   Ejercite hasta que aumente la respiracin y el corazn palpite rpido.   Beba agua extra cuando ejercite.   No haga ejercicio hasta lastimarse, sentirse mareado o que le falte mucho el aire.  La actividad fsica puede quemar alrededor de 150 caloras.  Correr 20 cuadras en 15 minutos.   Jugar vley durante 45 a 60  minutos.   Limpiar y encerar el auto durante 45 a 60 minutos.   Jugar ftbol americano de toque.   Caminar 25 cuadras en 35 minutos.   Empujar un cochecito 20 cuadras en 30 minutos.   Jugar baloncesto durante 30 minutos.   Rastrillar hojas secas durante 30 minutos.   Andar en bicicleta 80 cuadras en 30 minutos.     Caminar 30 cuadras en 30 minutos.   Bailar durante 30 minutos.   Quitar la nieve con una pala durante 15 minutos.   Nadar vigorosamente durante 20 minutos.   Subir escaleras durante 15 minutos.   Andar en bicicleta 60 cuadras durante 15 minutos.   Arreglar el jardn entre 30 y 45 minutos.   Saltar a la soga durante 15 minutos.   Limpiar vidrios o pisos durante 45 a 60 minutos.  Document Released: 02/18/2011 Document Revised: 07/27/2011 [facility] Patient Information 2012 [place], [place].Colocacin de un dispositivo intrauterino - Cuidados posteriores (Intrauterine Device Insertion, Care After) Siga estas instrucciones durante las prximas semanas. Estas indicaciones le proporcionan informacin general acerca de cmo deber cuidarse despus del procedimiento. El mdico tambin podr darle instrucciones ms especficas. El tratamiento ha sido planificado segn las prcticas mdicas actuales, pero en algunos casos pueden ocurrir problemas. Comunquese con el mdico si tiene algn problema o tiene dudas despus del procedimiento. QU ESPERAR DESPUS DEL PROCEDIMIENTO La insercin del DIU puede causar molestias, como clicos. que deberan mejorar una vez que el DIU est en su lugar. Podr tener sangrado despus del procedimiento. Esto es normal. Vara desde un sangrado ligero durante un par de Countrywide Financialdas hasta un sangrado similar al menstrual. Cuando el DIU est en su lugar, se extender un hilo de 1 a 2pulgadas (2,5 a 5cm) por el cuello del tero en la vagina. El hilo no debera molestarle a usted ni a su pareja. De lo contrario, consulte con su mdico.  INSTRUCCIONES PARA EL  CUIDADO EN EL HOGAR   Controle su DIU para asegurarse de que est en su lugar, antes de reanudar la actividad sexual. Tiene que sentir los hilos. Si no los siente, algo puede estar mal. El DIU puede haberse salido del tero o ste puede haber sido atravesado (perforado) durante la colocacin. Adems, si los hilos son ms largos, puede significar que el DIU se est saliendo del tero. Si ocurre alguno de Limited Brandsestos problemas, no estar protegida y podr Scientist, research (physical sciences)quedar embarazada.  Puede volver a Management consultanttener relaciones sexuales si no tiene problemas con el DIU. El DIU de cobre se considera efectivo y funciona de inmediato, si se inserta dentro de los [address]  [address]das del inicio del perodo. Ser necesario que utilice un mtodo anticonceptivo adicional durante 7 [address]as, si el DIU se inserta en algn otro momento del ciclo.  Controle que el DIU sigue en su lugar sintiendo los hilos despus de cada perodo menstrual.  Es posible que necesite tomar analgsicos, como acetaminofeno o ibuprofeno. Tome todos los medicamentos como le indic el mdico. SOLICITE ATENCIN MDICA SI:   Tiene un sangrado ms abundante o dura ms de un ciclo menstrual normal.  Tiene fiebre.  Siente clicos o dolor abdominal que no se alivian con medicamentos.  Siente dolor abdominal que no parece estar relacionado con el rea en que senta los clicos y Chief Technology Officerel dolor anteriormente.  Se siente mareada, inusualmente dbil o se desmaya.  Tiene flujo vaginal u olores anormales.  Siente dolor durante las The St. Paul Travelersrelaciones sexuales.  No puede sentir los hilos del DIU o los siente ms largos.  Siente que el DIU est en la abertura del cuello del tero, en la vagina.  Piensa que est embarazada o no tiene su perodo menstrual.  El hilo del DIU est lastimando a su pareja sexual. ASEGRESE DE QUE:  Comprende estas instrucciones.  Controlar su afeccin.  Recibir ayuda de inmediato si no mejora o si empeora.   Esta informacin no tiene como fin  reemplazar el  consejo del mdico. Asegrese de hacerle al mdico cualquier pregunta que tenga.   Document Released: 08/08/2012 Document Revised: 09/04/2013 Elsevier Interactive Patient Education [employer]

## 2015-09-11 ENCOUNTER — Encounter: Payer: Self-pay | Admitting: Gynecology

## 2015-10-14 ENCOUNTER — Encounter: Payer: Self-pay | Admitting: Gynecology

## 2015-10-14 ENCOUNTER — Ambulatory Visit (INDEPENDENT_AMBULATORY_CARE_PROVIDER_SITE_OTHER): Payer: BLUE CROSS/BLUE SHIELD | Admitting: Gynecology

## 2015-10-14 VITALS — BP 124/78

## 2015-10-14 DIAGNOSIS — Z30431 Encounter for routine checking of intrauterine contraceptive device: Secondary | ICD-10-CM | POA: Diagnosis not present

## 2015-10-14 NOTE — Progress Notes (Signed)
   Patient presented to the office for her 1 month follow-up after having placed a ParaGard T380A IUD. Patient had a heavy cycle with this first episode after the IUD was placed but otherwise done well. She is asymptomatic today. Patient declined flu vaccine today.  Exam: Abdomen: Soft nontender no rebound or guarding Pelvic: Bartholin urethra Skene was within normal limits Vagina: History blood present Cervix: IUD string visualized string trimmed Uterus: Anteverted normal size shape and consistency Adnexa: No palpable masses or tenderness Rectal exam not done  Assessment/plan: Patient one month status post placement ParaGard T380A IUD doing well. Patient scheduled to return back to the office next year for annual exam or when necessary. She was reminded to adhere to the recommended cholesterol lowering diet handout that had been provided recently as a result of her abnormal lipid profile. She was reminded to return back in 6 months a fasting state to repeat her fasting lipid profile. Also discussed importance of exercise 1 hour 3 times a week.

## 2016-03-15 ENCOUNTER — Other Ambulatory Visit: Payer: Self-pay | Admitting: Gynecology

## 2016-03-15 DIAGNOSIS — E7800 Pure hypercholesterolemia, unspecified: Secondary | ICD-10-CM

## 2016-09-09 ENCOUNTER — Encounter: Payer: Self-pay | Admitting: Gynecology

## 2016-09-09 ENCOUNTER — Ambulatory Visit (INDEPENDENT_AMBULATORY_CARE_PROVIDER_SITE_OTHER): Payer: BLUE CROSS/BLUE SHIELD | Admitting: Gynecology

## 2016-09-09 ENCOUNTER — Other Ambulatory Visit: Payer: Self-pay | Admitting: Anesthesiology

## 2016-09-09 VITALS — BP 120/72 | Ht 62.5 in | Wt 159.0 lb

## 2016-09-09 DIAGNOSIS — Z01419 Encounter for gynecological examination (general) (routine) without abnormal findings: Secondary | ICD-10-CM | POA: Diagnosis not present

## 2016-09-09 NOTE — Patient Instructions (Signed)
Control del colesterol  Los niveles de colesterol en el organismo estn determinados significativamente por su dieta. Los niveles de colesterol tambin se relacionan con la enfermedad cardaca. El material que sigue ayuda a explicar esta relacin y a analizar qu puede hacer para mantener su corazn sano. No todo el colesterol es malo. Las lipoprotenas de baja densidad (LDL) forman el colesterol "malo". El colesterol malo puede ocasionar depsitos de grasa que se acumulan en el interior de las arterias. Las lipoprotenas de alta densidad (HDL) es el colesterol "bueno". Ayuda a remover el colesterol LDL "malo" de la sangre. El colesterol es un factor de riesgo muy importante para la enfermedad cardaca. Otros factores de riesgo son la hipertensin arterial, el hbito de fumar, el estrs, la herencia y el peso.   El msculo cardaco obtiene el suministro de sangre a travs de las arterias coronarias. Si su colesterol LDL ("malo") est elevado y el HDL ("bueno") es bajo, tiene un factor de riesgo para que se formen depsitos de grasa en las arterias coronarias (los vasos sanguneos que suministran sangre al corazn). Esto hace que haya menos lugar para que la sangre circule. Sin la suficiente sangre y oxgeno, el msculo cardaco no puede funcionar correctamente, y usted podr sentir dolores en el pecho (angina pectoris). Cuando una arteria coronaria se cierra completamente, una parte del msculo cardaco puede morir (infarto de miocardio).  CONTROL DEL COLESTEROL Cuando el profesional que lo asiste enva la sangre al laboratorio para conocer el nivel de colesterol, puede realizarle tambin un perfil completo de los lpidos. Con esta prueba, se puede determinar la cantidad total de colesterol, as como los niveles de LDL y HDL. Los triglicridos son un tipo de grasa que circula en la sangre y que tambin puede utilizarse para determinar el riesgo de enfermedad  cardaca. En la siguiente tabla se establecen los nmeros ideales: Prueba: Colesterol total  Menos de 200 mg/dl.  Prueba: LDL "colesterol malo"  Menos de 100 mg/dl.   Menos de 70 mg/dl si tiene riesgo muy elevado de sufrir un ataque cardaco o muerte cardaca sbita.  Prueba: HDL "colesterol bueno"  Mujeres: Ms de 50 mg/dl.   Hombres: Ms de 40 mg/dl.  Prueba: Trigliceridos  Menos de 150 mg/dl.    CONTROL DEL COLESTEROL CON DIETA Aunque factores como el ejercicio y el estilo de vida son importantes, la "primera lnea de ataque" es la dieta. Esto se debe a que se sabe que ciertos alimentos hacen subir el colesterol y otros lo bajan. El objetivo debe ser equilibrar los alimentos, de modo que tengan un efecto sobre el colesterol y, an ms importante, reemplazar las grasas saturadas y trans con otros tipos de grasas, como las monoinsaturadas y las poliinsaturadas y cidos grasos omega-3 . En promedio, una persona no debe consumir ms de 15 a 17 g de grasas saturadas por da. Las grasas saturadas y trans se consideran grasas "malas", ya que elevan el colesterol LDL. Las grasas saturadas se encuentran principalmente en productos animales como carne, manteca y crema. Pero esto no significa que usted debe sacrificar todas sus comidas favoritas. Actualmente, como lo muestra el cuadro que figura al final de este documento, hay sustitutos de buen sabor, bajos en grasas y en colesterol, para la mayora de los alimentos que a usted le gusta comer. Elija aquellos alimentos alternativos que sean bajos en grasas o sin grasas. Elija cortes de carne del cuarto trasero o lomo ya que estos cortes son los que tienen menor cantidad de grasa   y colesterol. El pollo (sin piel), el pescado, la carne de ternera, y la pechuga de pavo molida son excelentes opciones. Elimine las carnes grasosas como los hotdogs o el salami. Los mariscos tienen poco o nada de grasas saturadas. Cuando consuma carne magra, carne de aves de  corral, o pescado, hgalo en porciones de 85 gramos (3 onzas). Las grasas trans tambin se llaman "aceites parcialmente hidrogenados". Son aceites manipulados cientficamente de modo que son slidos a temperatura ambiente, tienen una larga vida y mejoran el sabor y la textura de los alimentos a los que se agregan. Las grasas trans se encuentran en la margarina, masitas, crackers y alimentos horneados.  Para hornear y cocinar, el aceite es un excelente sustituto para la mantequilla. Los aceites monoinsaturados tienen un beneficio particular, ya que se cree que disminuyen el colesterol LDL (colesterol malo) y elevan el HDL. Deber evitar los aceites tropicales saturados como el de coco y el de palma.  Recuerde, adems, que puede comer sin restricciones los grupos de alimentos que son naturalmente libres de grasas saturadas y grasas trans, entre los que se incluyen el pescado, las frutas (excepto el aguacate), verduras, frijoles, cereales (cebada, arroz, cuzcuz, trigo) y las pastas (sin salsas con crema)   IDENTIFIQUE LOS ALIMENTOS QUE DISMINUYEN EL COLESTEROL  Pueden disminuir el colesterol las fibras solubles que estn en las frutas, como las manzanas, en los vegetales como el brcoli, las patatas y las zanahorias; en las legumbres como frijoles, guisantes y lentejas; y en los cereales como la cebada. Los alimentos fortificados con fitosteroles tambin pueden disminuir el colesterol. Debe consumir al menos 2 g de estos alimentos a diario para obtener el efecto de disminucin de colesterol.  En el supermercado, lea las etiquetas de los envases para identificar los alimentos bajos en grasas saturadas, libres de grasas trans y bajos en grasas, . Elija quesos que tengan solo de 2 a 3 g de grasa saturada por onza (28,35 g). Use una margarina que no dae el corazn, libre de grasas trans o aceite parcialmente hidrogenado. Al comprar alimentos horneados (galletitas dulces y galletas) evite el aceite parcialmente  hidrogenado. Los panes y bollos debern ser de granos enteros (harina de maz o de avena entera, en lugar de "harina" o "harina enriquecida"). Compre sopas en lata que no sean cremosas, con bajo contenido de sal y sin grasas adicionadas.   TCNICAS DE PREPARACIN DE LOS ALIMENTOS  Nunca fra los alimentos en aceite abundante. Si debe frer, hgalo en poco aceite y removiendo constantemente, porque as se utilizan muy pocas grasas, o utilice un spray antiadherente. Cuando le sea posible, hierva, hornee o ase las carnes y cocine los vegetales al vapor. En vez de aderezar los vegetales con mantequilla o margarina, utilice limn y hierbas, pur de manzanas y canela (para las calabazas y batatas), yogurt y salsa descremados y aderezos para ensaladas bajos en contenido graso.   BAJO EN GRASAS SATURADAS / SUSTITUTOS BAJOS EN GRASA  Carnes / Grasas saturadas (g)  Evite: Bife, corte graso (3 oz/85 g) / 11 g   Elija: Bife, corte magro (3 oz/85 g) / 4 g   Evite: Hamburguesa (3 oz/85 g) / 7 g   Elija:  [place]a magra (3 oz/85 g) / 5 g   Evite: Jamn (3 oz/85 g) / 6 g   Elija:  Jamn magro (3 oz/85 g) / 2.4 g   Evite: Pollo, con piel (3 oz/85 g), Carne oscura / 4 g   Elija:  Pollo, sin piel (  3 oz/85 g), Carne oscura / 2 g   Evite: Pollo, con piel (3 oz/85 g), Carne magra / 2.5 g   Elija: Pollo, sin piel (3 oz/85 g), Carne magra / 1 g  Lcteos / Grasas saturadas (g)  Evite: Leche entera (1 taza) / 5 g   Elija: Leche con bajo contenido de grasa, 2% (1 taza) / 3 g   Elija: Leche con bajo contenido de grasa, 1% (1 taza) / 1.5 g   Elija: Leche descremada (1 taza) / 0.3 g   Evite: Queso duro (1 oz/28 g) / 6 g   Elija: Queso descremado (1 oz/28 g) / 2-3 g   Evite: Queso cottage, 4% grasa (1 taza)/ 6.5 g   Elija: Queso cottage con bajo contenido de grasa, 1% grasa (1 taza)/ 1.5 g   Evite: Helado (1 taza) / 9 g   Elija: Sorbete (1 taza) / 2.5 g   Elija: Yogurt helado sin contenido de  grasa (1 taza) / 0.3 g   Elija: Barras de fruta congeladas / vestigios   Evite: Crema batida (1 cucharada) / 3.5 g   Elija: Batidos glac sin lcteos (1 cucharada) / 1 g  Condimentos / Grasas saturadas (g)  Evite: Mayonesa (1 cucharada) / 2 g   Elija: Mayonesa con bajo contenido de grasa (1 cucharada) / 1 g   Evite: Manteca (1 cucharada) / 7 g   Elija: Margarina extra light (1 cucharada) / 1 g   Evite: Aceite de coco (1 cucharada) / 11.8 g   Elija: Aceite de oliva (1 cucharada) / 1.8 g   Elija: Aceite de maz (1 cucharada) / 1.7 g   Elija: Aceite de crtamo (1 cucharada) / 1.2 g   Elija: Aceite de girasol (1 cucharada) / 1.4 g   Elija: Aceite de soja (1 cucharada) / 2.4 g   Elija: Aceite de canola (1 cucharada) / 1 g  Document Released: 11/14/2005 Document Revised: 07/27/2011 [employer] Patient Information 2012 [employer]. Ejercicios para perder peso (Exercise to Lose Weight) La actividad fsica y una dieta saludable ayudan a perder peso. El mdico podr sugerirle ejercicios especficos. IDEAS Y CONSEJOS PARA HACER EJERCICIOS  Elija opciones econmicas que disfrute hacer , como caminar, andar en bicicleta o los vdeos para ejercitarse.   Utilice las escaleras en lugar del ascensor.   Camine durante la hora del almuerzo.   Estacione el auto lejos del lugar de trabajo o estudio.   Concurra a un gimnasio o tome clases de gimnasia.   Comience con 5  10 minutos de actividad fsica por da. Ejercite hasta 30 minutos, 4 a 6 das por semana.   Utilice zapatos que tengan un buen soporte y ropas cmodas.   Elongue antes y despus de ejercitar.   Ejercite hasta que aumente la respiracin y el corazn palpite rpido.   Beba agua extra cuando ejercite.   No haga ejercicio hasta lastimarse, sentirse mareado o que le falte mucho el aire.  La actividad fsica puede quemar alrededor de 150 caloras.  Correr 20 cuadras en 15 minutos.   Jugar vley durante 45 a 60  minutos.   Limpiar y encerar el auto durante 45 a 60 minutos.   Jugar ftbol americano de toque.   Caminar 25 cuadras en 35 minutos.   Empujar un cochecito 20 cuadras en 30 minutos.   Jugar baloncesto durante 30 minutos.   Rastrillar hojas secas durante 30 minutos.   Andar en bicicleta 80 cuadras en 30 minutos.     Caminar 30 cuadras en 30 minutos.   Bailar durante 30 minutos.   Quitar la nieve con una pala durante 15 minutos.   Nadar vigorosamente durante 20 minutos.   Subir escaleras durante 15 minutos.   Andar en bicicleta 60 cuadras durante 15 minutos.   Arreglar el jardn entre 30 y 45 minutos.   Saltar a la soga durante 15 minutos.   Limpiar vidrios o pisos durante 45 a 60 minutos.  Document Released: 02/18/2011 Document Revised: 07/27/2011 [employer] Patient Information 2012 [employer]. 

## 2016-09-09 NOTE — Addendum Note (Signed)
Addended by: Rushie GoltzSPANGLER, Keyondra Lagrand on: 09/09/2016 11:47 AM   Modules accepted: Orders

## 2016-09-09 NOTE — Addendum Note (Signed)
Addended by: Berna SpareASTILLO, BLANCA A on: 09/09/2016 12:14 PM   Modules accepted: Orders

## 2016-09-09 NOTE — Progress Notes (Signed)
Cassandra Coffey [birth year] [id number]   History:    35 y.o.  for annual gyn exam with the only complaint is occasional spotting right before her menses. Review of her record indicated that she had a ParaGard T380A IUD placed in October 2016. Review of her record indicated she has lost 5 pounds from last year but she still overweight. She has never had any abnormal Pap smears in the past. Patient will return back next week in a fasting state for her blood work. Patient declined flu vaccine today.  Past medical history,surgical history, family history and social history were all reviewed and documented in the EPIC chart.  Gynecologic History Patient's last menstrual period was 08/16/2016. Contraception: IUD Last Pap: 2014. Results were: normal Last mammogram: Not indicated. Results were: Not indicated  Obstetric History OB History  Gravida Para Term Preterm AB Living  2 2       2   SAB TAB Ectopic Multiple Live Births               # Outcome Date GA Lbr Len/2nd Weight Sex Delivery Anes PTL Lv  2 Para           1 Para                ROS: A ROS was performed and pertinent positives and negatives are included in the history.  GENERAL: No fevers or chills. HEENT: No change in vision, no earache, sore throat or sinus congestion. NECK: No pain or stiffness. CARDIOVASCULAR: No chest pain or pressure. No palpitations. PULMONARY: No shortness of breath, cough or wheeze. GASTROINTESTINAL: No abdominal pain, nausea, vomiting or diarrhea, melena or bright red blood per rectum. GENITOURINARY: No urinary frequency, urgency, hesitancy or dysuria. MUSCULOSKELETAL: No joint or muscle pain, no back pain, no recent trauma. DERMATOLOGIC: No rash, no itching, no lesions. ENDOCRINE: No polyuria, polydipsia, no heat or cold intolerance. No recent change in weight. HEMATOLOGICAL: No anemia or easy bruising or bleeding. NEUROLOGIC: No headache, seizures, numbness, tingling or weakness. PSYCHIATRIC: No  depression, no loss of interest in normal activity or change in sleep pattern.     Exam: chaperone present  BP 120/72   Ht 5' 2.5" (1.588 m)   Wt 159 lb (72.1 kg)   LMP 08/16/2016 Comment: PARAGARD   BMI 28.62 kg/m   Body mass index is 28.62 kg/m.  General appearance : Well developed well nourished female. No acute distress HEENT: Eyes: no retinal hemorrhage or exudates,  Neck supple, trachea midline, no carotid bruits, no thyroidmegaly Lungs: Clear to auscultation, no rhonchi or wheezes, or rib retractions  Heart: Regular rate and rhythm, no murmurs or gallops Breast:Examined in sitting and supine position were symmetrical in appearance, no palpable masses or tenderness,  no skin retraction, no nipple inversion, no nipple discharge, no skin discoloration, no axillary or supraclavicular lymphadenopathy Abdomen: no palpable masses or tenderness, no rebound or guarding Extremities: no edema or skin discoloration or tenderness  Pelvic:  Bartholin, Urethra, Skene Glands: Within normal limits             Vagina: No gross lesions or discharge  Cervix: No gross lesions or discharge, IUD string seen  Uterus  anteverted, normal size, shape and consistency, non-tender and mobile  Adnexa  Without masses or tenderness  Anus and perineum  normal   Rectovaginal  normal sphincter tone without palpated masses or tenderness             Hemoccult not indicated  Assessment/Plan:  35 y.o. female for annual exam will return next week for the following screening blood work in a fasting state: Fasting lipid profile, comprehensive metabolic panel, TSH, CBC, and urinalysis. Pap smear was done today without HPV screening. Patient declined flu vaccine. Patient was provided with literature information on cholesterol lowering diet handout as well as on weight reduction.   Ok Edwards MD, 11:15 AM 09/09/2016

## 2016-09-12 ENCOUNTER — Other Ambulatory Visit: Payer: BLUE CROSS/BLUE SHIELD

## 2016-09-12 DIAGNOSIS — Z01419 Encounter for gynecological examination (general) (routine) without abnormal findings: Secondary | ICD-10-CM | POA: Diagnosis not present

## 2016-09-12 DIAGNOSIS — E7800 Pure hypercholesterolemia, unspecified: Secondary | ICD-10-CM

## 2016-09-12 LAB — CBC WITH DIFFERENTIAL/PLATELET
BASOS ABS: 0 {cells}/uL (ref 0–200)
Basophils Relative: 0 %
EOS PCT: 2 %
Eosinophils Absolute: 238 cells/uL (ref 15–500)
HCT: 37.6 % (ref 35.0–45.0)
Hemoglobin: 11.5 g/dL — ABNORMAL LOW (ref 11.7–15.5)
LYMPHS PCT: 22 %
Lymphs Abs: 2618 cells/uL (ref 850–3900)
MCH: 24.8 pg — AB (ref 27.0–33.0)
MCHC: 30.6 g/dL — AB (ref 32.0–36.0)
MCV: 81 fL (ref 80.0–100.0)
MONOS PCT: 7 %
MPV: 9.1 fL (ref 7.5–12.5)
Monocytes Absolute: 833 cells/uL (ref 200–950)
NEUTROS PCT: 69 %
Neutro Abs: 8211 cells/uL — ABNORMAL HIGH (ref 1500–7800)
PLATELETS: 454 10*3/uL — AB (ref 140–400)
RBC: 4.64 MIL/uL (ref 3.80–5.10)
RDW: 14.2 % (ref 11.0–15.0)
WBC: 11.9 10*3/uL — ABNORMAL HIGH (ref 3.8–10.8)

## 2016-09-12 LAB — PAP IG W/ RFLX HPV ASCU

## 2016-09-12 LAB — TSH: TSH: 2.1 m[IU]/L

## 2016-09-12 NOTE — Addendum Note (Signed)
Addended by: Esmeralda LinksHAYNES-LYMAN, Halsey Persaud S on: 09/12/2016 10:23 AM   Modules accepted: Orders

## 2016-09-13 ENCOUNTER — Encounter: Payer: Self-pay | Admitting: Gynecology

## 2016-09-13 LAB — COMPREHENSIVE METABOLIC PANEL
ALBUMIN: 4.3 g/dL (ref 3.6–5.1)
ALT: 11 U/L (ref 6–29)
AST: 12 U/L (ref 10–30)
Alkaline Phosphatase: 48 U/L (ref 33–115)
BUN: 13 mg/dL (ref 7–25)
CHLORIDE: 104 mmol/L (ref 98–110)
CO2: 21 mmol/L (ref 20–31)
CREATININE: 0.79 mg/dL (ref 0.50–1.10)
Calcium: 9.4 mg/dL (ref 8.6–10.2)
Glucose, Bld: 87 mg/dL (ref 65–99)
Potassium: 5.1 mmol/L (ref 3.5–5.3)
SODIUM: 139 mmol/L (ref 135–146)
Total Bilirubin: 0.4 mg/dL (ref 0.2–1.2)
Total Protein: 7.5 g/dL (ref 6.1–8.1)

## 2016-09-13 LAB — LIPID PANEL
CHOL/HDL RATIO: 5.5 ratio — AB (ref <=5.0)
CHOLESTEROL: 193 mg/dL (ref 125–200)
HDL: 35 mg/dL — AB (ref >=46)
LDL Cholesterol: 138 mg/dL — ABNORMAL HIGH (ref <130)
Triglycerides: 98 mg/dL (ref <150)
VLDL: 20 mg/dL (ref <30)

## 2016-09-13 LAB — URINALYSIS W MICROSCOPIC + REFLEX CULTURE
Bacteria, UA: NONE SEEN [HPF]
Bilirubin Urine: NEGATIVE
CASTS: NONE SEEN [LPF]
CRYSTALS: NONE SEEN [HPF]
Glucose, UA: NEGATIVE
KETONES UR: NEGATIVE
Leukocytes, UA: NEGATIVE
NITRITE: NEGATIVE
PH: 5.5 (ref 5.0–8.0)
Protein, ur: NEGATIVE
Specific Gravity, Urine: 1.026 (ref 1.001–1.035)
Yeast: NONE SEEN [HPF]

## 2016-09-15 LAB — URINE CULTURE

## 2017-04-12 ENCOUNTER — Encounter: Payer: Self-pay | Admitting: Gynecology

## 2017-09-15 ENCOUNTER — Encounter: Payer: Self-pay | Admitting: Obstetrics & Gynecology

## 2017-09-15 ENCOUNTER — Ambulatory Visit (INDEPENDENT_AMBULATORY_CARE_PROVIDER_SITE_OTHER): Payer: BLUE CROSS/BLUE SHIELD | Admitting: Obstetrics & Gynecology

## 2017-09-15 VITALS — BP 120/72 | Ht 62.25 in | Wt 166.0 lb

## 2017-09-15 DIAGNOSIS — N945 Secondary dysmenorrhea: Secondary | ICD-10-CM

## 2017-09-15 DIAGNOSIS — Z01419 Encounter for gynecological examination (general) (routine) without abnormal findings: Secondary | ICD-10-CM | POA: Diagnosis not present

## 2017-09-15 DIAGNOSIS — Z975 Presence of (intrauterine) contraceptive device: Secondary | ICD-10-CM | POA: Diagnosis not present

## 2017-09-15 NOTE — Progress Notes (Signed)
Cassandra Coffey 25-Jul-1981 [id number]   History:    36 y.o. G2P2L2  Stable boyfriend.  Children 3 and 30 yo.     RP:  Established patient presenting for annual gyn exam   HPI:  Well on Paragard x 2016, but heavy painful menses every month.  No pelvic pain outside periods.  No pain with IC.  Breasts wnl.  Mictions/BMs wnl.  Past medical history,surgical history, family history and social history were all reviewed and documented in the EPIC chart.  Gynecologic History Patient's last menstrual period was 09/04/2017. Contraception: IUD Last Pap: 08/2016. Results were: Negative Last mammogram: Never  Obstetric History OB History  Gravida Para Term Preterm AB Living  _0 SAB TAB Ectopic Multiple Live Births               # Outcome Date GA Lbr Len/2nd Weight Sex Delivery Anes PTL Lv  2 Para           1 Para                ROS: A ROS was performed and pertinent positives and negatives are included in the history.  GENERAL: No fevers or chills. HEENT: No change in vision, no earache, sore throat or sinus congestion. NECK: No pain or stiffness. CARDIOVASCULAR: No chest pain or pressure. No palpitations. PULMONARY: No shortness of breath, cough or wheeze. GASTROINTESTINAL: No abdominal pain, nausea, vomiting or diarrhea, melena or bright red blood per rectum. GENITOURINARY: No urinary frequency, urgency, hesitancy or dysuria. MUSCULOSKELETAL: No joint or muscle pain, no back pain, no recent trauma. DERMATOLOGIC: No rash, no itching, no lesions. ENDOCRINE: No polyuria, polydipsia, no heat or cold intolerance. No recent change in weight. HEMATOLOGICAL: No anemia or easy bruising or bleeding. NEUROLOGIC: No headache, seizures, numbness, tingling or weakness. PSYCHIATRIC: No depression, no loss of interest in normal activity or change in sleep pattern.     Exam:   BP 120/72   Ht 5' 2.25" (1.581 m)   Wt 166 lb (75.3 kg)   LMP 09/04/2017   BMI 30.12 kg/m   Body mass  index is 30.12 kg/m.  General appearance : Well developed well nourished female. No acute distress HEENT: Eyes: no retinal hemorrhage or exudates,  Neck supple, trachea midline, no carotid bruits, no thyroidmegaly Lungs: Clear to auscultation, no rhonchi or wheezes, or rib retractions  Heart: Regular rate and rhythm, no murmurs or gallops Breast:Examined in sitting and supine position were symmetrical in appearance, no palpable masses or tenderness,  no skin retraction, no nipple inversion, no nipple discharge, no skin discoloration, no axillary or supraclavicular lymphadenopathy Abdomen: no palpable masses or tenderness, no rebound or guarding Extremities: no edema or skin discoloration or tenderness  Pelvic: Vulva normal  Bartholin, Urethra, Skene Glands: Within normal limits             Vagina: No gross lesions or discharge  Cervix: No gross lesions or discharge.  Strings well-seen.  Pap reflex done.  Uterus  AV, normal size, shape and consistency, non-tender and mobile  Adnexa  Without masses or tenderness  Anus and perineum  normal    Assessment/Plan:  36 y.o. female for annual exam   1. Encounter for routine gynecological examination with Papanicolaou smear of cervix Normal gyn exam.  Pap reflex done.  Declines STI screen.  Breasts wnl. - CBC w/Diff - TSH - Vitamin D 1,25 dihydroxy - Lipid Profile - Comp Met (CMET) -  HgB A1c - Pap IG w/ reflex to HPV when ASC-U  2. IUD contraception Paragard IUD with heavy menses and dysmenorrhea, but after discussing alternative contraceptive methods, patient prefers to continue with Paragard IUD at this time.  She would like boyfriend to have a Vasectomy, but he would like to have children...  BCPs/Nuvaring/Contraceptive patch, Progestin IUD, Nexplanon, DepoProvera reviewed.  3. Secondary dysmenorrhea Probably worsened by Paragard IUD.  No evidence of infection.  IUD in good position.  Will try NSAIDs regularly during period.  Counseling  on above issues >50% x 15 minutes.  Marie-Lyne Lavoie MD, 12:16 PM 09/15/2017   

## 2017-09-16 NOTE — Patient Instructions (Signed)
1. Encounter for routine gynecological examination with Papanicolaou smear of cervix Normal gyn exam.  Pap reflex done.  Declines STI screen.  Breasts wnl. - CBC w/Diff - TSH - Vitamin D 1,25 dihydroxy - Lipid Profile - Comp Met (CMET) - HgB A1c - Pap IG w/ reflex to HPV when ASC-U  2. IUD contraception Paragard IUD with heavy menses and dysmenorrhea, but after discussing alternative contraceptive methods, patient prefers to continue with Paragard IUD at this time.  She would like boyfriend to have a Vasectomy, but he would like to have children...  BCPs/Nuvaring/Contraceptive patch, Progestin IUD, Nexplanon, DepoProvera reviewed.  3. Secondary dysmenorrhea Probably worsened by Paragard IUD.  No evidence of infection.  IUD in good position.  Will try NSAIDs regularly during period.  Cathren Harsh, fue un placer conecerla hoy!  Voy a informarla de Financial trader.  [place] (Health Maintenance, Female) Un estilo de vida saludable y los cuidados preventivos pueden favorecer considerablemente a la salud y Musician. Pregunte a su mdico cul es el cronograma de exmenes peridicos apropiado para usted. Esta es una buena oportunidad para consultarlo sobre cmo prevenir enfermedades y [place] sano. Adems de los controles, hay muchas otras cosas que puede hacer usted mismo. Los expertos han realizado numerosas investigaciones [employer] cambios en el estilo de vida y las medidas de prevencin que, [place], lo ayudarn a mantenerse sano. Solicite a su mdico ms informacin. EL PESO Y LA DIETA Consuma una dieta saludable.  Asegrese de [employer] verduras, frutas, productos lcteos de bajo contenido de [place] y Advertising account planner.  No consuma muchos alimentos de alto contenido de grasas slidas, azcares agregados o sal.  Realice actividad fsica con regularidad. Esta es una de las prcticas ms importantes que puede hacer por su salud. ? La Delorise Shiner  de los adultos deben hacer ejercicio durante al menos 161mnutos por semana. El ejercicio debe aumentar la frecuencia cardaca y pActorla transpiracin (ejercicio de i[place]. ? La mayora de los adultos tambin deben hacer ejercicios de elongacin al mToysRusveces a la semana. Agregue esto al su plan de ejercicio de intensidad moderada. Mantenga un peso saludable.  El ndice de masa corporal ([facility] es una medida que puede utilizarse para identificar posibles problemas de pBennett Proporciona una estimacin de la grasa corporal basndose en el peso y la altura. Su mdico puede ayudarle a dRadiation protection practitionerISimmsy a lScientist, forensico mTheatre managerun peso saludable.  Para las mujeres de 20aos o ms: ? Un IVital Sight Pcmenor de 18,5 se considera bajo peso. ? Un IRiverside Surgery Centerentre 18,5 y 24,9 es normal. ? Un IInstitute Of Orthopaedic Surgery LLCentre 25 y 29,9 se considera sobrepeso. ? Un IMC de 30 o ms se considera obesidad. Observe los niveles de colesterol y lpidos en la sangre.  Debe comenzar a rEnglish as a second language teacherde lpidos y cResearch officer, trade unionen la sangre a los 20aos y luego repetirlos cada 576aos  Es posible que nAutomotive engineerlos niveles de colesterol con mayor frecuencia si: ? Sus niveles de lpidos y colesterol son altos. ? Es mayor de [id number] ? Presenta un alto riesgo de padecer enfermedades cardacas. DETECCIN DE CNCER Cncer de pulmn  Se recomienda realizar exmenes de deteccin de cncer de pulmn a personas adultas entre 545y 866aos que estn en riesgo de dHorticulturist, commercialde pulmn por sus antecedentes de consumo de tabaco.  Se recomienda una tomografa computarizada de baja dosis de los pulmones todos los aos a las personas que: ? Fuman actualmente. ?  Hayan dejado el hbito en algn momento en los ltimos 15aos. ? Hayan fumado durante 30aos un paquete diario. Un paquete-ao equivale a fumar un promedio de un paquete de cigarrillos diario durante un ao.  Los exmenes de deteccin anuales deben continuar hasta que  hayan pasado 15aos desde que dej de fumar.  Ya no debern realizarse si tiene un problema de salud que le impida recibir tratamiento para Science writer de pulmn. Cncer de mama  Practique la autoconciencia de la mama. Esto significa reconocer la apariencia normal de sus mamas y cmo las siente.  Tambin significa realizar autoexmenes regulares de [employer]. Informe a su mdico sobre cualquier cambio, sin importar cun pequeo sea.  Si tiene entre 20 y 29 aos, un mdico debe realizarle un examen clnico de las mamas como parte del examen regular de [place], cada 1 a 3aos.  Si tiene 40aos o ms, debe Information systems manager clnico de las Microsoft. Tambin considere realizarse una [place] todos los [place].  Si tiene antecedentes familiares de cncer de mama, hable con su mdico para someterse a un estudio gentico.  Si tiene alto riesgo de Chief Financial Officer de mama, hable con su mdico para someterse a Public house manager y [employer].  La evaluacin del gen del cncer de mama (BRCA) se recomienda a mujeres que tengan familiares con cnceres relacionados con el BRCA. Los cnceres relacionados con el BRCA incluyen los siguientes: ? [place]. ? Ovario. ? Trompas. ? Cnceres de peritoneo.  Los resultados de la evaluacin determinarn la necesidad de asesoramiento gentico y de [place] de BRCA1 y BRCA2. Cncer de cuello del tero El mdico puede recomendarle que se haga pruebas peridicas de deteccin de cncer de los rganos de la pelvis (ovarios, tero y vagina). Estas pruebas incluyen un examen plvico, que abarca controlar si se produjeron cambios microscpicos en la superficie del cuello del tero (prueba de Papanicolaou). Pueden recomendarle que se haga estas pruebas cada 3aos, a partir de los 21aos.  A las mujeres que tienen entre 30 y 40aos, los mdicos pueden recomendarles que se sometan a exmenes plvicos y pruebas de  Papanicolaou cada 32aos, o a la prueba de Papanicolaou y el examen plvico en combinacin con estudios de deteccin del virus del papiloma humano (VPH) cada 5aos. Algunos tipos de VPH aumentan el riesgo de Chief Financial Officer de cuello del tero. La prueba para la deteccin del VPH tambin puede realizarse a mujeres de cualquier edad cuyos resultados de la prueba de Papanicolaou no sean claros.  Es posible que otros mdicos no recomienden exmenes de deteccin a mujeres no embarazadas que se consideran sujetos de bajo riesgo de Chief Financial Officer de pelvis y que no tienen sntomas. Pregntele al mdico si un examen plvico de deteccin es adecuado para usted.  Si ha recibido un tratamiento para Science writer cervical o una enfermedad que podra causar cncer, necesitar realizarse una prueba de Papanicolaou y controles durante al menos 36 aos de concluido el [place]. Si no se ha hecho el Papanicolaou con regularidad, debern volver a evaluarse los factores de riesgo (como tener un nuevo compaero sexual), para Teacher, adult education si debe realizarse los estudios nuevamente. Algunas mujeres sufren problemas mdicos que aumentan la probabilidad de Museum/gallery curator cncer de cuello del tero. En estos casos, el mdico podr [employer] se realicen controles y pruebas de Papanicolaou con ms frecuencia. Cncer colorrectal  Este tipo de cncer puede detectarse y a menudo prevenirse.  Por lo general,  los estudios de rutina se deben Medical laboratory scientific officer a Field seismologist a Proofreader de los 38 aos y [place] 37 aos.  Sin embargo, el mdico podr aconsejarle que lo haga antes, si tiene factores de riesgo para el cncer de colon.  Tambin puede recomendarle que use un kit de prueba para Hydrologist en la materia fecal.  Es posible que se use una pequea cmara en el extremo de un tubo para examinar directamente el colon (sigmoidoscopia o colonoscopia) a fin de Hydrographic surveyor formas tempranas de cncer colorrectal.  Los exmenes de rutina generalmente  comienzan a los 60aos.  El examen directo del colon se debe repetir cada 5 a 10aos hasta los 75aos. Sin embargo, es posible que se realicen exmenes con mayor frecuencia, si se detectan formas tempranas de plipos precancerosos o pequeos bultos. Cncer de piel  Revise la piel de la cabeza a los pies con regularidad.  Informe a su mdico si aparecen nuevos lunares o los que tiene se modifican, especialmente en su forma y color.  Tambin notifique al mdico si tiene un lunar que es ms grande que el tamao de una goma de lpiz.  Siempre use pantalla solar. Aplique pantalla solar de Kerry Dory y repetida a lo largo del Training and development officer.  Protjase usando mangas y [employer], un sombrero de ala ancha y gafas para el sol, siempre que se encuentre en el exterior. ENFERMEDADES CARDACAS, DIABETES E HIPERTENSIN ARTERIAL  La hipertensin arterial causa enfermedades cardacas y [place] el riesgo de ictus. La hipertensin arterial es ms probable en los siguientes casos: ? Las personas que tienen la presin arterial en el extremo del rango normal (100-139/85-89 mm Hg). ? [employer] con sobrepeso u obesidad. ? Scientist, water quality.  Si usted tiene entre 18 y 39 aos, debe medirse la presin arterial cada 3 a 5 aos. Si usted tiene 40 aos o ms, debe medirse la presin arterial [employer]. Debe medirse la presin arterial dos veces: una vez cuando est en un hospital o una clnica y la otra vez cuando est en otro sitio. Registre el promedio de [employer]. Para controlar su presin arterial cuando no est en un hospital o [place], puede usar lo siguiente: ? Jorje Guild automtica para medir la presin arterial en una farmacia. ? Un monitor para medir la presin arterial en el hogar.  Si tiene entre 33 y 43 aos, consulte a su mdico si debe tomar aspirina para prevenir el ictus.  Realcese exmenes de deteccin de la diabetes con regularidad. Esto incluye la toma de [place] de sangre para controlar el nivel de azcar en la sangre durante el [place]. ? Si tiene un peso normal y un bajo riesgo de padecer diabetes, realcese este anlisis cada tres aos despus de los 45aos. ? Si tiene sobrepeso y un alto riesgo de padecer diabetes, considere someterse a este anlisis antes o con mayor frecuencia. PREVENCIN DE INFECCIONES HepatitisB  Si tiene un riesgo ms alto de Museum/gallery curator hepatitis B, debe someterse a un examen de deteccin de este virus. Se considera que tiene un alto riesgo de contraer hepatitis B si: ? Naci en un pas donde la hepatitis B es frecuente. Pregntele a su mdico qu pases son considerados de Public affairs consultant. ? Sus padres nacieron en un pas de alto riesgo y usted no recibi una vacuna que lo proteja contra la hepatitis B (vacuna contra la hepatitis B). ? [place]. ? [place] agujas para inyectarse drogas. ? Vive  con alguien que tiene hepatitis B. ? Ha tenido sexo con alguien que tiene hepatitis B. ? Recibe tratamiento de hemodilisis. ? Toma ciertos medicamentos para el cncer, trasplante de rganos y afecciones autoinmunitarias. Hepatitis C  Se recomienda un anlisis de [place] para: ? [employer] 1945 y 1965. ? Todas las personas que tengan un riesgo de haber contrado hepatitis C. Enfermedades de transmisin sexual (ETS).  Debe realizarse pruebas de deteccin de enfermedades de transmisin sexual (ETS), incluidas gonorrea y clamidia si: ? Es sexualmente activo y es menor de 23FTD. ? Es mayor de 24aos, y Investment banker, operational informa que corre riesgo de tener este tipo de infecciones. ? La actividad sexual ha cambiado desde que le hicieron la ltima prueba de deteccin y tiene un riesgo mayor de Best boy clamidia o Radio broadcast assistant. Pregntele al mdico si usted tiene riesgo.  Si no tiene el VIH, pero corre riesgo de infectarse por el virus, se recomienda tomar diariamente un medicamento recetado para evitar la infeccin. Esto se  conoce como profilaxis previa a la exposicin. Se considera que est en riesgo si: ? Es [place] sexualmente y no [place] preservativos habitualmente o no conoce el estado del VIH de sus Advertising copywriter. ? Se inyecta drogas. ? Es [place] sexualmente con Ardelia Mems pareja que tiene VIH. Consulte a su mdico para saber si tiene un alto riesgo de infectarse por el VIH. Si opta por comenzar la profilaxis previa a la exposicin, primero debe realizarse anlisis de deteccin del VIH. Luego, le harn anlisis cada 68mses mientras est tomando los medicamentos para la profilaxis previa a la exposicin. [facility] Si es premenopusica y puede quedar [place] solicite a su mdico asesoramiento previo a la concepcin.  Si puede quedar embarazada, tome 400 a [id number]([id number]) de cido fAnheuser-Busch  Si desea evitar el embarazo, hable con su mdico sobre el control de la natalidad (anticoncepcin). OSTEOPOROSIS Y MENOPAUSIA  La osteoporosis es una enfermedad en la que los huesos pierden los minerales y la fuerza por el avance de la edad. El resultado pueden ser fracturas graves en los hPort Vue El riesgo de osteoporosis puede identificarse con uArdelia Memsprueba de densidad sea.  Si tiene 65aos o ms, o si est en riesgo de sufrir osteoporosis y fracturas, pregunte a su mdico si debe someterse a exmenes.  Consulte a su mdico si debe tomar un suplemento de calcio o de vitamina D para reducir el riesgo de osteoporosis.  La menopausia puede presentar ciertos sntomas fsicos y rGaffer  La terapia de reemplazo hormonal puede reducir algunos de estos sntomas y rGaffer Consulte a su mdico para saber si la terapia de reemplazo hormonal es conveniente para usted. INSTRUCCIONES PARA EL CUIDADO EN EL HOGAR  Realcese los estudios de rutina de la salud, dentales y de lPublic librarian  [place]  No consuma ningn producto que contenga tabaco, lo que incluye cigarrillos, tabaco de mHigher education careers advisero  cPsychologist, sport and exercise  Si est embarazada, no beba alcohol.  Si est amamantando, reduzca el consumo de alcohol y la frecuencia con la que consume.  Si es mujer y no est embarazada limite el consumo de alcohol a no ms de 1 medida por da. Una medida equivale a 12onzas de cerveza, 5onzas de vino o 1onzas de bebidas alcohlicas de alta graduacin.  No consuma drogas.  No comparta agujas.  Solicite ayuda a su mdico si necesita apoyo o informacin para abandonar las drogas.  Informe a su mdico si a  menudo se siente deprimido.  Notifique a su mdico si alguna vez ha sido vctima de abuso o si no se siente seguro en su hogar. Esta informacin no tiene Marine scientist el consejo del mdico. Asegrese de hacerle al mdico cualquier pregunta que tenga. Document Released: 11/03/2011 Document Revised: 12/05/2014 Document Reviewed: 08/18/2015 Elsevier Interactive Patient Education  [employer].

## 2017-09-18 LAB — PAP IG W/ RFLX HPV ASCU

## 2017-09-20 LAB — COMPREHENSIVE METABOLIC PANEL
AG Ratio: 1.4 (calc) (ref 1.0–2.5)
ALBUMIN MSPROF: 4.7 g/dL (ref 3.6–5.1)
ALKALINE PHOSPHATASE (APISO): 66 U/L (ref 33–115)
ALT: 15 U/L (ref 6–29)
AST: 13 U/L (ref 10–30)
BILIRUBIN TOTAL: 0.4 mg/dL (ref 0.2–1.2)
BUN: 10 mg/dL (ref 7–25)
CO2: 24 mmol/L (ref 20–32)
CREATININE: 0.77 mg/dL (ref 0.50–1.10)
Calcium: 9.6 mg/dL (ref 8.6–10.2)
Chloride: 100 mmol/L (ref 98–110)
GLOBULIN: 3.4 g/dL (ref 1.9–3.7)
Glucose, Bld: 76 mg/dL (ref 65–99)
POTASSIUM: 4.1 mmol/L (ref 3.5–5.3)
SODIUM: 135 mmol/L (ref 135–146)
TOTAL PROTEIN: 8.1 g/dL (ref 6.1–8.1)

## 2017-09-20 LAB — CBC WITH DIFFERENTIAL/PLATELET
Basophils Absolute: 67 cells/uL (ref 0–200)
Basophils Relative: 0.5 %
EOS PCT: 1.5 %
Eosinophils Absolute: 201 cells/uL (ref 15–500)
HCT: 38.3 % (ref 35.0–45.0)
Hemoglobin: 12.3 g/dL (ref 11.7–15.5)
Lymphs Abs: 2814 cells/uL (ref 850–3900)
MCH: 25.6 pg — ABNORMAL LOW (ref 27.0–33.0)
MCHC: 32.1 g/dL (ref 32.0–36.0)
MCV: 79.6 fL — ABNORMAL LOW (ref 80.0–100.0)
MPV: 9.8 fL (ref 7.5–12.5)
Monocytes Relative: 6.5 %
NEUTROS PCT: 70.5 %
Neutro Abs: 9447 cells/uL — ABNORMAL HIGH (ref 1500–7800)
PLATELETS: 524 10*3/uL — AB (ref 140–400)
RBC: 4.81 10*6/uL (ref 3.80–5.10)
RDW: 13 % (ref 11.0–15.0)
Total Lymphocyte: 21 %
WBC mixed population: 871 cells/uL (ref 200–950)
WBC: 13.4 10*3/uL — AB (ref 3.8–10.8)

## 2017-09-20 LAB — HEMOGLOBIN A1C
Hgb A1c MFr Bld: 5.3 % of total Hgb (ref <5.7)
Mean Plasma Glucose: 105 (calc)
eAG (mmol/L): 5.8 (calc)

## 2017-09-20 LAB — LIPID PANEL
Cholesterol: 210 mg/dL — ABNORMAL HIGH (ref <200)
HDL: 34 mg/dL — AB (ref >50)
LDL Cholesterol (Calc): 143 mg/dL (calc) — ABNORMAL HIGH
NON-HDL CHOLESTEROL (CALC): 176 mg/dL — AB (ref <130)
Total CHOL/HDL Ratio: 6.2 (calc) — ABNORMAL HIGH (ref <5.0)
Triglycerides: 190 mg/dL — ABNORMAL HIGH (ref <150)

## 2017-09-20 LAB — TSH: TSH: 1.56 mIU/L

## 2017-09-20 LAB — VITAMIN D 1,25 DIHYDROXY
Vitamin D 1, 25 (OH)2 Total: 80 pg/mL — ABNORMAL HIGH (ref 18–72)
Vitamin D2 1, 25 (OH)2: <8 pg/mL
Vitamin D3 1, 25 (OH)2: 80 pg/mL

## 2017-09-27 ENCOUNTER — Ambulatory Visit (INDEPENDENT_AMBULATORY_CARE_PROVIDER_SITE_OTHER): Payer: BLUE CROSS/BLUE SHIELD | Admitting: Emergency Medicine

## 2017-09-27 ENCOUNTER — Encounter: Payer: Self-pay | Admitting: Emergency Medicine

## 2017-09-27 VITALS — BP 108/60 | HR 92 | Temp 99.3°F | Resp 16 | Ht 62.5 in | Wt 169.2 lb

## 2017-09-27 DIAGNOSIS — E7800 Pure hypercholesterolemia, unspecified: Secondary | ICD-10-CM | POA: Diagnosis not present

## 2017-09-27 DIAGNOSIS — L309 Dermatitis, unspecified: Secondary | ICD-10-CM | POA: Insufficient documentation

## 2017-09-27 DIAGNOSIS — L308 Other specified dermatitis: Secondary | ICD-10-CM

## 2017-09-27 MED ORDER — TRIAMCINOLONE ACETONIDE 0.1 % EX CREA: 1.0000 "application " | TOPICAL_CREAM | Freq: Two times a day (BID) | CUTANEOUS | 0 refills | Status: DC

## 2017-09-27 NOTE — Assessment & Plan Note (Signed)
Will attempt to control with diet; repeat labs in 3-6 months.

## 2017-09-27 NOTE — Patient Instructions (Addendum)
     IF you received an x-ray today, you will receive an invoice from [facility]. Please contact [facility] at [phone] with questions or concerns regarding your invoice.   IF you received labwork today, you will receive an invoice from [facility]. Please contact [facility] at [phone] with questions or concerns regarding your invoice.   Our billing staff will not be able to assist you with questions regarding bills from these companies.  You will be contacted with the lab results as soon as they are available. The fastest way to get your results is to activate your My Chart account. Instructions are located on the last page of this paperwork. If you have not heard from us regarding the results in 2 weeks, please contact this office.     Cholesterol Cholesterol is a fat. Your body needs a small amount of cholesterol. Cholesterol (plaque) may build up in your blood vessels (arteries). That makes you more likely to have a heart attack or stroke. You cannot feel your cholesterol level. Having a blood test is the only way to find out if your level is high. Keep your test results. Work with your doctor to keep your cholesterol at a good level. What do the results mean?  Total cholesterol is how much cholesterol is in your blood.  LDL is bad cholesterol. This is the type that can build up. Try to have low LDL.  HDL is good cholesterol. It cleans your blood vessels and carries LDL away. Try to have high HDL.  Triglycerides are fat that the body can store or burn for energy. What are good levels of cholesterol?  Total cholesterol below 200.  LDL below 100 is good for people who have health risks. LDL below 70 is good for people who have very high risks.  HDL above 40 is good. It is best to have HDL of 60 or higher.  Triglycerides below 150. How can I lower my cholesterol? Diet Follow your diet program as told by your doctor.  Choose fish, white meat chicken, or  turkey that is roasted or baked. Try not to eat red meat, fried foods, sausage, or lunch meats.  Eat lots of fresh fruits and vegetables.  Choose whole grains, beans, pasta, potatoes, and cereals.  Choose olive oil, corn oil, or canola oil. Only use small amounts.  Try not to eat butter, mayonnaise, shortening, or palm kernel oils.  Try not to eat foods with trans fats.  Choose low-fat or nonfat dairy foods. ? Drink skim or nonfat milk. ? Eat low-fat or nonfat yogurt and cheeses. ? Try not to drink whole milk or cream. ? Try not to eat ice cream, egg yolks, or full-fat cheeses.  Healthy desserts include angel food cake, ginger snaps, animal crackers, hard candy, popsicles, and low-fat or nonfat frozen yogurt. Try not to eat pastries, cakes, pies, and cookies.  Exercise Follow your exercise program as told by your doctor.  Be more active. Try gardening, walking, and taking the stairs.  Ask your doctor about ways that you can be more active.  Medicine  Take over-the-counter and prescription medicines only as told by your doctor.  This information is not intended to replace advice given to you by your health care provider. Make sure you discuss any questions you have with your health care provider. Document Released: 02/10/2009 Document Revised: 06/15/2016 Document Reviewed: 05/26/2016 Elsevier Interactive Patient Education  2017 Elsevier Inc.  

## 2017-09-27 NOTE — Progress Notes (Signed)
Cassandra Coffey 36 y.o.   Chief Complaint  Patient presents with  . Follow-up    after GYN exam - elevated cholesterol    HISTORY OF PRESENT ILLNESS: This is a 36 y.o. female here for follow up of high cholesterol levels done at recent GYN examination. Has no complaints. No medical issues.  HPI   Prior to Admission medications   Not on File    No Known Allergies  Patient Active Problem List   Diagnosis Date Noted  . Elevated cholesterol 09/27/2017  . IUD (intrauterine device) in place 09/10/2015  . Mastodynia, female 05/04/2015    Past Medical History:  Diagnosis Date  . Anemia     No past surgical history on file.  Social History   Social History  . Marital status: Married    Spouse name: N/A  . Number of children: N/A  . Years of education: N/A   Occupational History  . Not on file.   Social History Main Topics  . Smoking status: Never Smoker  . Smokeless tobacco: Never Used  . Alcohol use No  . Drug use: No  . Sexual activity: Yes    Birth control/ protection: Condom   Other Topics Concern  . Not on file   Social History Narrative  . No narrative on file    Family History  Problem Relation Age of Onset  . Cancer Maternal Aunt        OVARIAN  . Breast cancer Maternal Aunt      Review of Systems  Constitutional: Negative.  Negative for chills, fever and weight loss.  HENT: Negative.   Eyes: Negative.   Respiratory: Negative.   Cardiovascular: Positive for chest pain (has had costochondritis in the past). Negative for palpitations.  Gastrointestinal: Negative.  Negative for abdominal pain, diarrhea, nausea and vomiting.  Genitourinary: Negative.  Negative for dysuria and hematuria.  Musculoskeletal: Negative.   Skin: Positive for rash (eczema on legs).  Neurological: Negative.  Negative for dizziness.  All other systems reviewed and are negative.  Vitals:   09/27/17 1007  BP: 108/60  Pulse: 92  Resp: 16  Temp: 99.3 F  (37.4 C)  SpO2: 99%     Physical Exam  Constitutional: She is oriented to person, place, and time. She appears well-developed and well-nourished.  HENT:  Head: Normocephalic and atraumatic.  Right Ear: External ear normal.  Left Ear: External ear normal.  Nose: Nose normal.  Mouth/Throat: Oropharynx is clear and moist.  Eyes: Pupils are equal, round, and reactive to light. Conjunctivae and EOM are normal.  Neck: Normal range of motion. Neck supple. No JVD present. No thyromegaly present.  Cardiovascular: Normal rate, regular rhythm, normal heart sounds and intact distal pulses.   Pulmonary/Chest: Effort normal and breath sounds normal.  Abdominal: Soft. Bowel sounds are normal. She exhibits no distension. There is no tenderness.  Musculoskeletal: Normal range of motion.  Lymphadenopathy:    She has no cervical adenopathy.  Neurological: She is alert and oriented to person, place, and time. No sensory deficit. She exhibits normal muscle tone.  Skin: Capillary refill takes less than 2 seconds. Rash (eczema in lower legs) noted.  Psychiatric: She has a normal mood and affect. Her behavior is normal.  Vitals reviewed.   Lab Results  Component Value Date   CHOL 210 (H) 09/15/2017   HDL 34 (L) 09/15/2017   LDLCALC 138 (H) 09/12/2016   TRIG 190 (H) 09/15/2017   CHOLHDL 6.2 (H) 09/15/2017   Elevated cholesterol  Will attempt to control with diet; repeat labs in 3-6 months.   ASSESSMENT & PLAN: [name] was seen today for follow-up.  Diagnoses and all orders for this visit:  Elevated cholesterol  Other eczema -     triamcinolone cream (KENALOG) 0.1 %; Apply 1 application topically 2 (two) times daily. -     Ambulatory referral to Dermatology    Patient Instructions       IF you received an x-ray today, you will receive an invoice from [facility]. Please contact [facility] at [phone] with questions or concerns regarding your invoice.   IF you  received labwork today, you will receive an invoice from [facility]. Please contact [facility] at [phone] with questions or concerns regarding your invoice.   Our billing staff will not be able to assist you with questions regarding bills from these companies.  You will be contacted with the lab results as soon as they are available. The fastest way to get your results is to activate your My Chart account. Instructions are located on the last page of this paperwork. If you have not heard from us regarding the results in 2 weeks, please contact this office.     Cholesterol Cholesterol is a fat. Your body needs a small amount of cholesterol. Cholesterol (plaque) may build up in your blood vessels (arteries). That makes you more likely to have a heart attack or stroke. You cannot feel your cholesterol level. Having a blood test is the only way to find out if your level is high. Keep your test results. Work with your doctor to keep your cholesterol at a good level. What do the results mean?  Total cholesterol is how much cholesterol is in your blood.  LDL is bad cholesterol. This is the type that can build up. Try to have low LDL.  HDL is good cholesterol. It cleans your blood vessels and carries LDL away. Try to have high HDL.  Triglycerides are fat that the body can store or burn for energy. What are good levels of cholesterol?  Total cholesterol below 200.  LDL below 100 is good for people who have health risks. LDL below 70 is good for people who have very high risks.  HDL above 40 is good. It is best to have HDL of 60 or higher.  Triglycerides below 150. How can I lower my cholesterol? Diet Follow your diet program as told by your doctor.  Choose fish, white meat chicken, or [place] that is roasted or baked. Try not to eat red meat, fried foods, sausage, or lunch meats.  Eat lots of fresh fruits and vegetables.  Choose whole grains, beans, pasta, potatoes, and cereals.  Choose  olive oil, corn oil, or canola oil. Only use small amounts.  Try not to eat butter, mayonnaise, shortening, or palm kernel oils.  Try not to eat foods with trans fats.  Choose low-fat or nonfat dairy foods. ? Drink skim or nonfat milk. ? Eat low-fat or nonfat yogurt and cheeses. ? Try not to drink whole milk or cream. ? Try not to eat ice cream, egg yolks, or full-fat cheeses.  Healthy desserts include angel food cake, ginger snaps, animal crackers, hard candy, popsicles, and low-fat or nonfat frozen yogurt. Try not to eat pastries, cakes, pies, and cookies.  Exercise Follow your exercise program as told by your doctor.  Be more active. Try gardening, walking, and taking the stairs.  Ask your doctor about ways that you can be more active.  Medicine  Take over-the-counter and prescription medicines only as told by your doctor.  This information is not intended to replace advice given to you by your health care provider. Make sure you discuss any questions you have with your health care provider. Document Released: 02/10/2009 Document Revised: 06/15/2016 Document Reviewed: 05/26/2016 Elsevier Interactive Patient Education  2017 Elsevier Inc.      Edwina Barth, MD [facility]

## 2019-08-29 ENCOUNTER — Encounter: Payer: BLUE CROSS/BLUE SHIELD | Admitting: Obstetrics & Gynecology

## 2019-09-03 ENCOUNTER — Other Ambulatory Visit: Payer: Self-pay

## 2019-09-04 ENCOUNTER — Ambulatory Visit (INDEPENDENT_AMBULATORY_CARE_PROVIDER_SITE_OTHER): Payer: BC Managed Care – PPO | Admitting: Obstetrics & Gynecology

## 2019-09-04 ENCOUNTER — Encounter: Payer: Self-pay | Admitting: Obstetrics & Gynecology

## 2019-09-04 VITALS — BP 120/78 | Ht 62.25 in | Wt 166.0 lb

## 2019-09-04 DIAGNOSIS — Z6830 Body mass index (BMI) 30.0-30.9, adult: Secondary | ICD-10-CM

## 2019-09-04 DIAGNOSIS — Z30432 Encounter for removal of intrauterine contraceptive device: Secondary | ICD-10-CM

## 2019-09-04 DIAGNOSIS — E559 Vitamin D deficiency, unspecified: Secondary | ICD-10-CM | POA: Diagnosis not present

## 2019-09-04 DIAGNOSIS — T8332XA Displacement of intrauterine contraceptive device, initial encounter: Secondary | ICD-10-CM | POA: Diagnosis not present

## 2019-09-04 DIAGNOSIS — Z01419 Encounter for gynecological examination (general) (routine) without abnormal findings: Secondary | ICD-10-CM

## 2019-09-04 DIAGNOSIS — E6609 Other obesity due to excess calories: Secondary | ICD-10-CM

## 2019-09-04 DIAGNOSIS — E785 Hyperlipidemia, unspecified: Secondary | ICD-10-CM | POA: Diagnosis not present

## 2019-09-04 DIAGNOSIS — Z975 Presence of (intrauterine) contraceptive device: Secondary | ICD-10-CM

## 2019-09-04 NOTE — Addendum Note (Signed)
Addended by: Thurnell Garbe A on: 09/04/2019 02:22 PM   Modules accepted: Orders

## 2019-09-04 NOTE — Patient Instructions (Signed)
1. Encounter for routine gynecological examination with Papanicolaou smear of cervix Normal gynecologic exam.  Pap reflex done.  Breast exam normal.  Fasting health labs here today. - CBC - Comp Met (CMET) - TSH - Lipid panel - VITAMIN D 25 Hydroxy (Vit-D Deficiency, Fractures)  2. IUD contraception ParaGard IUD since 2016.  IUD found to be displaced with the IUD device visible partially coming out at the exocervix.  The IUD was therefore easily removed.  Given that patient also has heavy periods, decision to F/U for Mirena IUD insertion.  Strict condom use with spermicide in the meantime.  3. IUD migration, initial encounter ParaGard IUD partially coming out at the exocervix.  Easily removed.  4. Encounter for IUD removal ParaGard IUD displaced, partially coming out at the exocervix.  Easily removed by clamping the IUD.  IUD complete and intact.  IUD shown to patient and discarded.  5. Class 1 obesity due to excess calories without serious comorbidity with body mass index (BMI) of 30.0 to 30.9 in adult Recommend a lower calorie/carb diet such as [employer].  Aerobic physical activities 5 times a week with weightlifting every 2 days.  Kizzi, it was a pleasure seeing you today!  I will inform you of your results as soon as they are available.

## 2019-09-04 NOTE — Progress Notes (Signed)
Cassandra Coffey 1981/03/10 [id number]   History:    38 y.o. G2P2L2 Married  RP:  Established patient presenting for annual gyn exam   HPI: Heavy painful periods every month.  Paragard IUD x 2016.  Husband has felt the IUD strings recently.  Urine/BMs normal.  Breasts normal.  BMI 30.12.  Walking.  Fasting Health Labs here today.  Past medical history,surgical history, family history and social history were all reviewed and documented in the EPIC chart.  Gynecologic History Patient's last menstrual period was 08/26/2019. Contraception: Paragard IUD x 2016 Last Pap: 08/2017. Results were: Negative Last mammogram: Never Bone Density: Never Colonoscopy: Never  Obstetric History OB History  Gravida Para Term Preterm AB Living  _0 SAB TAB Ectopic Multiple Live Births               # Outcome Date GA Lbr Len/2nd Weight Sex Delivery Anes PTL Lv  2 Para           1 Para              ROS: A ROS was performed and pertinent positives and negatives are included in the history.  GENERAL: No fevers or chills. HEENT: No change in vision, no earache, sore throat or sinus congestion. NECK: No pain or stiffness. CARDIOVASCULAR: No chest pain or pressure. No palpitations. PULMONARY: No shortness of breath, cough or wheeze. GASTROINTESTINAL: No abdominal pain, nausea, vomiting or diarrhea, melena or bright red blood per rectum. GENITOURINARY: No urinary frequency, urgency, hesitancy or dysuria. MUSCULOSKELETAL: No joint or muscle pain, no back pain, no recent trauma. DERMATOLOGIC: No rash, no itching, no lesions. ENDOCRINE: No polyuria, polydipsia, no heat or cold intolerance. No recent change in weight. HEMATOLOGICAL: No anemia or easy bruising or bleeding. NEUROLOGIC: No headache, seizures, numbness, tingling or weakness. PSYCHIATRIC: No depression, no loss of interest in normal activity or change in sleep pattern.     Exam:   BP 120/78   Ht 5' 2.25" (1.581 m)   Wt 166 lb  (75.3 kg)   LMP 08/26/2019 Comment: PARAGARD  BMI 30.12 kg/m   Body mass index is 30.12 kg/m.  General appearance : Well developed well nourished female. No acute distress HEENT: Eyes: no retinal hemorrhage or exudates,  Neck supple, trachea midline, no carotid bruits, no thyroidmegaly Lungs: Clear to auscultation, no rhonchi or wheezes, or rib retractions  Heart: Regular rate and rhythm, no murmurs or gallops Breast:Examined in sitting and supine position were symmetrical in appearance, no palpable masses or tenderness,  no skin retraction, no nipple inversion, no nipple discharge, no skin discoloration, no axillary or supraclavicular lymphadenopathy Abdomen: no palpable masses or tenderness, no rebound or guarding Extremities: no edema or skin discoloration or tenderness  Pelvic: Vulva: Normal             Vagina: No gross lesions or discharge  Cervix: No gross lesions or discharge.  Pap reflex done.  IUD itself visible at the Exocervix.  Given the IUD displacement, it was removed.  Easy removal of Paragard IUD, no Cx, well tolerated.  The IUD was complete, intact, shown to patient and discarded.  Uterus  AV, normal size, shape and consistency, non-tender and mobile  Adnexa  Without masses or tenderness  Anus: Normal   Assessment/Plan:  38 y.o. female for annual exam   1. Encounter for routine gynecological examination with Papanicolaou smear of cervix Normal gynecologic exam.  Pap reflex done.  Breast exam normal.  Fasting health labs here today. - CBC - Comp Met (CMET) - TSH - Lipid panel - VITAMIN D 25 Hydroxy (Vit-D Deficiency, Fractures)  2. IUD contraception ParaGard IUD since 2016.  IUD found to be displaced with the IUD device visible partially coming out at the exocervix.  The IUD was therefore easily removed.  Given that patient also has heavy periods, decision to F/U for Mirena IUD insertion.  Strict condom use with spermicide in the meantime.  3. IUD migration,  initial encounter ParaGard IUD partially coming out at the exocervix.  Easily removed.  4. Encounter for IUD removal ParaGard IUD displaced, partially coming out at the exocervix.  Easily removed by clamping the IUD.  IUD complete and intact.  IUD shown to patient and discarded.  5. Class 1 obesity due to excess calories without serious comorbidity with body mass index (BMI) of 30.0 to 30.9 in adult Recommend a lower calorie/carb diet such as [place] [employer].  Aerobic physical activities 5 times a week with weightlifting every 2 days.  Princess Bruins MD, 12:42 PM 09/04/2019

## 2019-09-05 LAB — COMPREHENSIVE METABOLIC PANEL
AG Ratio: 1.6 (calc) (ref 1.0–2.5)
ALT: 12 U/L (ref 6–29)
AST: 13 U/L (ref 10–30)
Albumin: 4.6 g/dL (ref 3.6–5.1)
Alkaline phosphatase (APISO): 57 U/L (ref 31–125)
BUN: 11 mg/dL (ref 7–25)
CO2: 26 mmol/L (ref 20–32)
Calcium: 9.4 mg/dL (ref 8.6–10.2)
Chloride: 103 mmol/L (ref 98–110)
Creat: 0.77 mg/dL (ref 0.50–1.10)
Globulin: 2.9 g/dL (calc) (ref 1.9–3.7)
Glucose, Bld: 79 mg/dL (ref 65–99)
Potassium: 4.8 mmol/L (ref 3.5–5.3)
Sodium: 137 mmol/L (ref 135–146)
Total Bilirubin: 0.3 mg/dL (ref 0.2–1.2)
Total Protein: 7.5 g/dL (ref 6.1–8.1)

## 2019-09-05 LAB — LIPID PANEL
Cholesterol: 205 mg/dL — ABNORMAL HIGH (ref <200)
HDL: 38 mg/dL — ABNORMAL LOW (ref >=50)
LDL Cholesterol (Calc): 140 mg/dL (calc) — ABNORMAL HIGH
Non-HDL Cholesterol (Calc): 167 mg/dL (calc) — ABNORMAL HIGH (ref <130)
Total CHOL/HDL Ratio: 5.4 (calc) — ABNORMAL HIGH (ref <5.0)
Triglycerides: 138 mg/dL (ref <150)

## 2019-09-05 LAB — CBC
HCT: 29.7 % — ABNORMAL LOW (ref 35.0–45.0)
Hemoglobin: 9 g/dL — ABNORMAL LOW (ref 11.7–15.5)
MCH: 22 pg — ABNORMAL LOW (ref 27.0–33.0)
MCHC: 30.3 g/dL — ABNORMAL LOW (ref 32.0–36.0)
MCV: 72.4 fL — ABNORMAL LOW (ref 80.0–100.0)
MPV: 10.2 fL (ref 7.5–12.5)
Platelets: 556 10*3/uL — ABNORMAL HIGH (ref 140–400)
RBC: 4.1 10*6/uL (ref 3.80–5.10)
RDW: 14.7 % (ref 11.0–15.0)
WBC: 13.2 10*3/uL — ABNORMAL HIGH (ref 3.8–10.8)

## 2019-09-05 LAB — TSH: TSH: 1.29 mIU/L

## 2019-09-05 LAB — VITAMIN D 25 HYDROXY (VIT D DEFICIENCY, FRACTURES): Vit D, 25-Hydroxy: 12 ng/mL — ABNORMAL LOW (ref 30–100)

## 2019-09-06 LAB — PAP IG W/ RFLX HPV ASCU

## 2019-09-13 ENCOUNTER — Other Ambulatory Visit: Payer: Self-pay | Admitting: Obstetrics & Gynecology

## 2019-09-13 ENCOUNTER — Other Ambulatory Visit: Payer: Self-pay | Admitting: Anesthesiology

## 2019-09-13 DIAGNOSIS — E559 Vitamin D deficiency, unspecified: Secondary | ICD-10-CM

## 2019-09-13 MED ORDER — VITAMIN D (ERGOCALCIFEROL) 1.25 MG (50000 UNIT) PO CAPS: 50000.0000 [IU] | ORAL_CAPSULE | ORAL | 0 refills | Status: DC

## 2019-09-19 ENCOUNTER — Ambulatory Visit (INDEPENDENT_AMBULATORY_CARE_PROVIDER_SITE_OTHER): Payer: BC Managed Care – PPO | Admitting: Emergency Medicine

## 2019-09-19 ENCOUNTER — Other Ambulatory Visit: Payer: Self-pay

## 2019-09-19 ENCOUNTER — Encounter: Payer: Self-pay | Admitting: Emergency Medicine

## 2019-09-19 VITALS — BP 108/72 | HR 90 | Temp 98.7°F | Ht 62.0 in | Wt 168.6 lb

## 2019-09-19 DIAGNOSIS — L918 Other hypertrophic disorders of the skin: Secondary | ICD-10-CM | POA: Diagnosis not present

## 2019-09-19 DIAGNOSIS — E785 Hyperlipidemia, unspecified: Secondary | ICD-10-CM

## 2019-09-19 DIAGNOSIS — L309 Dermatitis, unspecified: Secondary | ICD-10-CM

## 2019-09-19 DIAGNOSIS — D509 Iron deficiency anemia, unspecified: Secondary | ICD-10-CM | POA: Diagnosis not present

## 2019-09-19 MED ORDER — ROSUVASTATIN CALCIUM 10 MG PO TABS: 10.0000 mg | ORAL_TABLET | Freq: Every day | ORAL | 3 refills | Status: DC

## 2019-09-19 NOTE — Patient Instructions (Addendum)
If you have lab work done today you will be contacted with your lab results within the next 2 weeks.  If you have not heard from [place] then please contact us. The fastest way to get your results is to register for My Chart.   IF you received an x-ray today, you will receive an invoice from [facility]. Please contact [facility] at [phone] with questions or concerns regarding your invoice.   IF you received labwork today, you will receive an invoice from [place]. Please contact [facility] at [phone] with questions or concerns regarding your invoice.   Our billing staff will not be able to assist you with questions regarding bills from these companies.  You will be contacted with the lab results as soon as they are available. The fastest way to get your results is to activate your My Chart account. Instructions are located on the last page of this paperwork. If you have not heard from [place] regarding the results in 2 weeks, please contact this office.     Colesterol elevado High Cholesterol  El colesterol elevado es una afeccin en la que la sangre tiene niveles altos de una sustancia [place], cerosa y parecida a la grasa (colesterol). El organismo humano necesita una pequea cantidad de colesterol. El hgado fabrica todo el colesterol que el organismo necesita. El exceso de colesterol proviene de los alimentos que comemos. La sangre transporta el colesterol desde el hgado a travs de los vasos sanguneos. Si tiene el colesterol elevado, este puede depositarse (formar placas) en las paredes de los vasos sanguneos (arterias). Las Occupational hygienist y la rigidez [place] arterias. Las placas de colesterol aumentan el riesgo de sufrir un infarto de miocardio y un accidente cerebrovascular. Trabaje con el mdico para [employer] concentraciones de colesterol en un rango saludable. Qu incrementa el riesgo? Es ms probable que Orthoptist en  las personas que:  Consumen alimentos con alto contenido de grasa animal (grasa saturada) o colesterol.  Tienen sobrepeso.  No hacen suficiente ejercicio fsico.  Tienen antecedentes familiares de colesterol elevado. Cules son los signos o los sntomas? Esta afeccin no presenta sntomas. Cmo se diagnostica? Esta afeccin podra diagnosticarse a [place] de anlisis de [place].  Si es mayor de 20aos, es posible que el mdico le controle el colesterol cada [id number].  Los controles pueden ser ms frecuentes si ya tuvo el colesterol elevado u otros factores de riesgo de enfermedades cardacas. En el anlisis de sangre de [place], se determina lo siguiente:  El colesterol "malo" (colesterol LDL). Este es el principal tipo de colesterol que causa enfermedades cardacas. El nivel recomendado de LDL es de menos de100.  El colesterol "bueno" (colesterol HDL). Este tipo ayuda a Tour manager las enfermedades [place] arterias y arrastrando el LDL. El nivel recomendado de HDL es de60 o superior.  Triglicridos. Estos son grasas que el organismo puede Financial controller o quemar como fuente de [place]. El nivel recomendado de triglicridos es de menos de 150.  Colesterol total. Esta es una medicin de la cantidad total de colesterol en la sangre, que incluye el colesterol LDL, el colesterol HDL y los triglicridos. El valor saludable es de menos de200. Cmo se trata? Esta afeccin se trata con cambios en la dieta y en el estilo de vida, y con medicamentos. Cambios en la [employer], la ingesta de una mayor cantidad de cereales integrales, frutas, verduras, frutos secos y pescado.  Tambin  podran incluir la reduccin del consumo de carnes rojas y alimentos con Medical laboratory scientific officer. Cambios en el estilo de vida  Entre ellos, realizar sesiones de ejercicios aerbicos durante, por lo menos, , 3veces por semana. Por ejemplo, caminar, andar  en bicicleta y nadar. Los ejercicios aerbicos junto con una dieta sana pueden ayudar a que se Dietitian en un peso saludable.  Los cambios tambin podran incluir dejar de fumar. Medicamentos  Por lo general, se administran medicamentos si con los Mudlogger y en el estilo de vida no se logra reducir el colesterol hasta niveles saludables.  El mdico podra recetarle estatinas. Se ha demostrado que las estatinas [employer] niveles de [place], lo que puede reducir el riesgo de Marine scientist una enfermedad cardaca. Siga estas indicaciones en su casa: Comida y bebida Si se lo indic el mdico:  Coma pollo (sin piel), pescado, ternera, mariscos, pechuga de [place] y cortes de carne roja de pulpa o de lomo.  No coma alimentos fritos ni carnes grasosas, como salchichas y salame.  Coma muchas frutas, como manzanas.  Coma gran cantidad de verduras, como brcoli, papas y zanahorias.  Coma porotos, guisantes secos y lentejas.  Coma cereales, como cebada, arroz, cuscs y trigo burgol.  Coma pastas sin salsas con crema.  Tome [employer] o semidescremada, y coma yogures y quesos descremados o semidescremados.  No coma ni beba [employer], crema, helado, yemas de huevo ni quesos duros.  No coma margarinas en barra ni untables que contengan grasas trans (que tambin se conocen como aceites parcialmente hidrogenados).  No coma aceites tropicales saturados, como el de coco y el de [place].  No coma tortas, galletas, galletitas ni otros productos horneados que contengan grasas trans.  Instrucciones generales  Haga ejercicio segn las indicaciones del mdico. Aumente la cantidad de ejercicio fsico que realiza mediante actividades como jardinera, salir a Advertising account planner o usar las escaleras.  Tome los medicamentos de venta libre y los recetados solamente como se lo haya indicado el mdico.  No consuma ningn producto que contenga nicotina o tabaco, como cigarrillos y  Administrator, Civil Service. Si necesita ayuda para dejar de fumar, consulte al mdico.  Concurra a todas las visitas de control como se lo haya indicado el mdico. Esto es importante. Comunquese con un mdico si:  Tiene dificultad para seguir una dieta sana o mantener un peso saludable.  Necesita ayuda para comenzar un programa de ejercicios.  Necesita ayuda para dejar de fumar. Solicite ayuda de inmediato si:  Midwife.  Tiene dificultad para respirar. Esta informacin no tiene Theme park manager el consejo del mdico. Asegrese de hacerle al mdico cualquier pregunta que tenga. Document Released: 11/14/2005 Document Revised: 02/20/2017 Document Reviewed: 05/14/2016 Elsevier Patient Education  2020 [employer].

## 2019-09-19 NOTE — Progress Notes (Signed)
Cassandra Coffey 38 y.o.   No chief complaint on file.   HISTORY OF PRESENT ILLNESS: This is a 38 y.o. female here for evaluation of low cholesterol. Had GYN evaluation recently blood work showed low vitamin D levels, iron deficiency anemia, and abnormal lipid profile. Also has a history of eczema and multiple skin tags.  Requesting dermatology referral. No other complaints or medical concerns today. Was started on vitamin D supplementation 50,000 units weekly.  HPI   Prior to Admission medications   Medication Sig Start Date End Date Taking? Authorizing Provider  Vitamin D, Ergocalciferol, (DRISDOL) 1.25 MG (50000 UT) CAPS capsule Take 1 capsule (50,000 Units total) by mouth every 7 (seven) days. 09/13/19  Yes Princess Bruins, MD    Not on File  Patient Active Problem List   Diagnosis Date Noted  . Elevated cholesterol 09/27/2017  . Eczema 09/27/2017  . IUD (intrauterine device) in place 09/10/2015  . Mastodynia, female 05/04/2015    Past Medical History:  Diagnosis Date  . Anemia     No past surgical history on file.  Social History   Socioeconomic History  . Marital status: Married    Spouse name: Not on file  . Number of children: Not on file  . Years of education: Not on file  . Highest education level: Not on file  Occupational History  . Not on file  Social Needs  . Financial resource strain: Not on file  . Food insecurity    Worry: Not on file    Inability: Not on file  . Transportation needs    Medical: Not on file    Non-medical: Not on file  Tobacco Use  . Smoking status: Never Smoker  . Smokeless tobacco: Never Used  Substance and Sexual Activity  . Alcohol use: No    Alcohol/week: 0.0 standard drinks  . Drug use: No  . Sexual activity: Yes    Birth control/protection: Condom  Lifestyle  . Physical activity    Days per week: Not on file    Minutes per session: Not on file  . Stress: Not on file  Relationships  . Social  Herbalist on phone: Not on file    Gets together: Not on file    Attends religious service: Not on file    Active member of club or organization: Not on file    Attends meetings of clubs or organizations: Not on file    Relationship status: Not on file  . Intimate partner violence    Fear of current or ex partner: Not on file    Emotionally abused: Not on file    Physically abused: Not on file    Forced sexual activity: Not on file  Other Topics Concern  . Not on file  Social History Narrative  . Not on file    Family History  Problem Relation Age of Onset  . Cancer Maternal Aunt        OVARIAN  . Breast cancer Maternal Aunt      Review of Systems  Constitutional: Negative.  Negative for chills and fever.  HENT: Negative.  Negative for congestion and sore throat.   Respiratory: Negative.  Negative for cough and shortness of breath.   Cardiovascular: Negative.  Negative for chest pain and palpitations.  Gastrointestinal: Negative.  Negative for abdominal pain, nausea and vomiting.  Genitourinary: Negative.  Negative for dysuria and hematuria.  Musculoskeletal: Negative.   Skin: Negative.  Negative for rash.  Neurological:  Negative for dizziness and headaches.  All other systems reviewed and are negative.  Today's Vitals   09/19/19 1104  BP: 108/72  Pulse: 90  Temp: 98.7 F (37.1 C)  TempSrc: Oral  Weight: 168 lb 9.6 oz (76.5 kg)  Height:  (1.575 m)   Body mass index is 30.84 kg/m.   Physical Exam Vitals signs reviewed.  Constitutional:      Appearance: Normal appearance.  HENT:     Head: Normocephalic.  Eyes:     Extraocular Movements: Extraocular movements intact.     Pupils: Pupils are equal, round, and reactive to light.  Neck:     Musculoskeletal: Normal range of motion and neck supple.  Cardiovascular:     Rate and Rhythm: Normal rate and regular rhythm.     Heart sounds: Normal heart sounds.  Pulmonary:     Effort: Pulmonary  effort is normal.     Breath sounds: Normal breath sounds.  Musculoskeletal: Normal range of motion.  Skin:    General: Skin is warm and dry.     Capillary Refill: Capillary refill takes less than 2 seconds.     Comments: Multiple skin tags.  Birthmark left upper arm.  Neurological:     General: No focal deficit present.     Mental Status: She is alert and oriented to person, place, and time.  Psychiatric:        Mood and Affect: Mood normal.        Behavior: Behavior normal.    A total of 25 minutes was spent in the room with the patient, greater than 50% of which was in counseling/coordination of care regarding dyslipidemia and cardiovascular risk associated with it, new medication and side effects, diet and nutrition, importance of physical exercise, vitamin D and iron deficiency and need of vitamin supplementation, skin findings and need for dermatology referral, prognosis, and need for follow-up and lipid profile repeat.   ASSESSMENT & PLAN: Diagnoses and all orders for this visit:  Dyslipidemia -     rosuvastatin (CRESTOR) 10 MG tablet; Take 1 tablet (10 mg total) by mouth daily.  Eczema, unspecified type -     Ambulatory referral to Dermatology  Multiple acquired skin tags -     Ambulatory referral to Dermatology  Iron deficiency anemia, unspecified iron deficiency anemia type    Patient Instructions       If you have lab work done today you will be contacted with your lab results within the next 2 weeks.  If you have not heard from [place] then please contact us. The fastest way to get your results is to register for My Chart.   IF you received an x-ray today, you will receive an invoice from [facility]. Please contact [facility] at [phone] with questions or concerns regarding your invoice.   IF you received labwork today, you will receive an invoice from [facility]. Please contact [facility] at [phone] with questions or concerns regarding  your invoice.   Our billing staff will not be able to assist you with questions regarding bills from these companies.  You will be contacted with the lab results as soon as they are available. The fastest way to get your results is to activate your My Chart account. Instructions are located on the last page of this paperwork. If you have not heard from [place] regarding the results in 2 weeks, please contact this office.     Colesterol elevado High Cholesterol  El colesterol elevado es [place]  afeccin en la que la sangre tiene niveles altos de una sustancia [place], cerosa y parecida a la grasa (colesterol). El organismo humano necesita una pequea cantidad de colesterol. El hgado fabrica todo el colesterol que el organismo necesita. El exceso de colesterol proviene de los alimentos que comemos. La sangre transporta el colesterol desde el hgado a travs de los vasos sanguneos. Si tiene el colesterol elevado, este puede depositarse (formar placas) en las paredes de los vasos sanguneos (arterias). Las IT trainer y la rigidez de las arterias. Las placas de colesterol aumentan el riesgo de sufrir un infarto de miocardio y un accidente cerebrovascular. Trabaje con el mdico para [employer] concentraciones de colesterol en un rango saludable. Qu incrementa el riesgo? Es ms probable que Dietitian en las personas que:  Consumen alimentos con alto contenido de grasa animal (grasa saturada) o colesterol.  Tienen sobrepeso.  No hacen suficiente ejercicio fsico.  Tienen antecedentes familiares de colesterol elevado. Cules son los signos o los sntomas? Esta afeccin no presenta sntomas. Cmo se diagnostica? Esta afeccin podra diagnosticarse a [employer] de [place] de [place].  Si es mayor de 20aos, es posible que el mdico le controle el colesterol cada [id number].  Los controles pueden ser ms frecuentes si ya tuvo el colesterol  elevado u otros factores de riesgo de enfermedades cardacas. En el anlisis de sangre de [place], se determina lo siguiente:  El colesterol "malo" (colesterol LDL). Este es el principal tipo de colesterol que causa enfermedades cardacas. El nivel recomendado de LDL es de menos de100.  El colesterol "bueno" (colesterol HDL). Este tipo ayuda a Health visitor las enfermedades cardacas limpiando las arterias y arrastrando el LDL. El nivel recomendado de HDL es de60 o superior.  Triglicridos. Estos son grasas que el organismo puede Academic librarian o quemar como fuente de [place]. El nivel recomendado de triglicridos es de menos de 150.  Colesterol total. Esta es una medicin de la cantidad total de colesterol en la sangre, que incluye el colesterol LDL, el colesterol HDL y los triglicridos. El valor saludable es de menos de200. Cmo se trata? Esta afeccin se trata con cambios en la dieta y en el estilo de vida, y con medicamentos. Cambios en la [employer], la ingesta de una mayor cantidad de cereales integrales, frutas, verduras, frutos secos y pescado.  Tambin podran incluir la reduccin del consumo de carnes rojas y alimentos con Medical laboratory scientific officer. Cambios en el estilo de vida  Entre ellos, realizar sesiones de ejercicios aerbicos durante, por lo menos, , 3veces por semana. Por ejemplo, caminar, andar en bicicleta y nadar. Los ejercicios aerbicos junto con una dieta sana pueden ayudar a que se Dietitian en un peso saludable.  Los cambios tambin podran incluir dejar de fumar. Medicamentos  Por lo general, se administran medicamentos si con los Mudlogger y en el estilo de vida no se logra reducir el colesterol hasta niveles saludables.  El mdico podra recetarle estatinas. Se ha demostrado que las estatinas [employer] niveles de [place], lo que puede reducir el riesgo de Marine scientist una enfermedad cardaca. Siga estas indicaciones en su  casa: Comida y bebida Si se lo indic el mdico:  Coma pollo (sin piel), pescado, ternera, mariscos, pechuga de [place] y cortes de carne roja de pulpa o de lomo.  No coma alimentos fritos ni carnes grasosas, como salchichas y salame.  Coma muchas frutas, como manzanas.  Coma gran cantidad de verduras,  como brcoli, papas y zanahorias.  Coma porotos, guisantes secos y lentejas.  Coma cereales, como cebada, arroz, cuscs y trigo burgol.  Coma pastas sin salsas con crema.  Tome PPG Industriesleche descremada o semidescremada, y coma yogures y quesos descremados o semidescremados.  No coma ni beba Eastman Kodakleche entera, crema, helado, yemas de huevo ni quesos duros.  No coma margarinas en barra ni untables que contengan grasas trans (que tambin se conocen como aceites parcialmente hidrogenados).  No coma aceites tropicales saturados, como el de coco y el de [place].  No coma tortas, galletas, galletitas ni otros productos horneados que contengan grasas trans.  Instrucciones generales  Haga ejercicio segn las indicaciones del mdico. Aumente la cantidad de ejercicio fsico que realiza mediante actividades como jardinera, salir a Advertising account plannercaminar o usar las escaleras.  Tome los medicamentos de venta libre y los recetados solamente como se lo haya indicado el mdico.  No consuma ningn producto que contenga nicotina o tabaco, como cigarrillos y Administrator, Civil Servicecigarrillos electrnicos. Si necesita ayuda para dejar de fumar, consulte al mdico.  Concurra a todas las visitas de control como se lo haya indicado el mdico. Esto es importante. Comunquese con un mdico si:  Tiene dificultad para seguir una dieta sana o mantener un peso saludable.  Necesita ayuda para comenzar un programa de ejercicios.  Necesita ayuda para dejar de fumar. Solicite ayuda de inmediato si:  Midwifeiente dolor en el pecho.  Tiene dificultad para respirar. Esta informacin no tiene Theme park managercomo fin reemplazar el consejo del mdico. Asegrese de hacerle al  mdico cualquier pregunta que tenga. Document Released: 11/14/2005 Document Revised: 02/20/2017 Document Reviewed: 05/14/2016 Elsevier Patient Education  2020 Elsevier Inc.      Edwina BarthMiguel Fredi Geiler, MD [facility]

## 2019-10-03 ENCOUNTER — Emergency Department (HOSPITAL_COMMUNITY): Payer: BC Managed Care – PPO

## 2019-10-03 ENCOUNTER — Encounter (HOSPITAL_COMMUNITY): Payer: Self-pay | Admitting: Emergency Medicine

## 2019-10-03 ENCOUNTER — Telehealth (INDEPENDENT_AMBULATORY_CARE_PROVIDER_SITE_OTHER): Payer: BC Managed Care – PPO | Admitting: Emergency Medicine

## 2019-10-03 ENCOUNTER — Emergency Department (HOSPITAL_COMMUNITY)
Admission: EM | Admit: 2019-10-03 | Discharge: 2019-10-03 | Disposition: A | Discharge status: Home / Self Care | Payer: BC Managed Care – PPO | Attending: Emergency Medicine | Admitting: Emergency Medicine

## 2019-10-03 ENCOUNTER — Encounter: Payer: Self-pay | Admitting: Emergency Medicine

## 2019-10-03 ENCOUNTER — Other Ambulatory Visit: Payer: Self-pay

## 2019-10-03 VITALS — Ht 62.0 in | Wt 165.0 lb

## 2019-10-03 DIAGNOSIS — R531 Weakness: Secondary | ICD-10-CM | POA: Diagnosis not present

## 2019-10-03 DIAGNOSIS — R42 Dizziness and giddiness: Secondary | ICD-10-CM | POA: Insufficient documentation

## 2019-10-03 DIAGNOSIS — R112 Nausea with vomiting, unspecified: Secondary | ICD-10-CM | POA: Diagnosis not present

## 2019-10-03 DIAGNOSIS — R519 Headache, unspecified: Secondary | ICD-10-CM | POA: Diagnosis not present

## 2019-10-03 DIAGNOSIS — Z20828 Contact with and (suspected) exposure to other viral communicable diseases: Secondary | ICD-10-CM | POA: Insufficient documentation

## 2019-10-03 DIAGNOSIS — R0602 Shortness of breath: Secondary | ICD-10-CM | POA: Diagnosis not present

## 2019-10-03 LAB — COMPREHENSIVE METABOLIC PANEL
ALT: 18 U/L (ref 0–44)
AST: 17 U/L (ref 15–41)
Albumin: 3.9 g/dL (ref 3.5–5.0)
Alkaline Phosphatase: 53 U/L (ref 38–126)
Anion gap: 9 (ref 5–15)
BUN: 12 mg/dL (ref 6–20)
CO2: 20 mmol/L — ABNORMAL LOW (ref 22–32)
Calcium: 9.1 mg/dL (ref 8.9–10.3)
Chloride: 107 mmol/L (ref 98–111)
Creatinine, Ser: 0.76 mg/dL (ref 0.44–1.00)
GFR calc Af Amer: >60 mL/min (ref >60)
GFR calc non Af Amer: >60 mL/min (ref >60)
Glucose, Bld: 140 mg/dL — ABNORMAL HIGH (ref 70–99)
Potassium: 4.3 mmol/L (ref 3.5–5.1)
Sodium: 136 mmol/L (ref 135–145)
Total Bilirubin: 0.3 mg/dL (ref 0.3–1.2)
Total Protein: 7.4 g/dL (ref 6.5–8.1)

## 2019-10-03 LAB — CBC WITH DIFFERENTIAL/PLATELET
Abs Immature Granulocytes: 0.08 10*3/uL — ABNORMAL HIGH (ref 0.00–0.07)
Basophils Absolute: 0.1 10*3/uL (ref 0.0–0.1)
Basophils Relative: 1 %
Eosinophils Absolute: 0.1 10*3/uL (ref 0.0–0.5)
Eosinophils Relative: 1 %
HCT: 32.2 % — ABNORMAL LOW (ref 36.0–46.0)
Hemoglobin: 9.6 g/dL — ABNORMAL LOW (ref 12.0–15.0)
Immature Granulocytes: 1 %
Lymphocytes Relative: 12 %
Lymphs Abs: 1.7 10*3/uL (ref 0.7–4.0)
MCH: 21.9 pg — ABNORMAL LOW (ref 26.0–34.0)
MCHC: 29.8 g/dL — ABNORMAL LOW (ref 30.0–36.0)
MCV: 73.3 fL — ABNORMAL LOW (ref 80.0–100.0)
Monocytes Absolute: 0.8 10*3/uL (ref 0.1–1.0)
Monocytes Relative: 5 %
Neutro Abs: 12 10*3/uL — ABNORMAL HIGH (ref 1.7–7.7)
Neutrophils Relative %: 80 %
Platelets: 477 10*3/uL — ABNORMAL HIGH (ref 150–400)
RBC: 4.39 MIL/uL (ref 3.87–5.11)
RDW: 16.2 % — ABNORMAL HIGH (ref 11.5–15.5)
WBC: 14.7 10*3/uL — ABNORMAL HIGH (ref 4.0–10.5)
nRBC: 0 % (ref 0.0–0.2)

## 2019-10-03 LAB — URINALYSIS, ROUTINE W REFLEX MICROSCOPIC
Bilirubin Urine: NEGATIVE
Glucose, UA: NEGATIVE mg/dL
Hgb urine dipstick: NEGATIVE
Ketones, ur: NEGATIVE mg/dL
Leukocytes,Ua: NEGATIVE
Nitrite: NEGATIVE
Protein, ur: NEGATIVE mg/dL
Specific Gravity, Urine: 1.02 (ref 1.005–1.030)
pH: 8 (ref 5.0–8.0)

## 2019-10-03 LAB — SARS CORONAVIRUS 2 (TAT 6-24 HRS): SARS Coronavirus 2: NEGATIVE

## 2019-10-03 LAB — TROPONIN I (HIGH SENSITIVITY): Troponin I (High Sensitivity): 2 ng/L (ref <18)

## 2019-10-03 LAB — I-STAT BETA HCG BLOOD, ED (MC, WL, AP ONLY): I-stat hCG, quantitative: <5 m[IU]/mL (ref <5)

## 2019-10-03 LAB — LACTIC ACID, PLASMA
Lactic Acid, Venous: 1.8 mmol/L (ref 0.5–1.9)
Lactic Acid, Venous: 2.1 mmol/L (ref 0.5–1.9)

## 2019-10-03 MED ORDER — SODIUM CHLORIDE 0.9 % IV BOLUS
1000.0000 mL | Freq: Once | INTRAVENOUS | Status: AC
  Administered 2019-10-03: 14:00:00 1000 mL via INTRAVENOUS

## 2019-10-03 MED ORDER — MECLIZINE HCL 12.5 MG PO TABS: 12.5000 mg | ORAL_TABLET | Freq: Three times a day (TID) | ORAL | 0 refills | Status: DC | PRN

## 2019-10-03 MED ORDER — PROCHLORPERAZINE EDISYLATE 10 MG/2ML IJ SOLN
10.0000 mg | Freq: Once | INTRAMUSCULAR | Status: AC
  Administered 2019-10-03: 14:00:00 10 mg via INTRAVENOUS
  Filled 2019-10-03: qty 2

## 2019-10-03 MED ORDER — MECLIZINE HCL 25 MG PO TABS
25.0000 mg | ORAL_TABLET | Freq: Once | ORAL | Status: AC
  Administered 2019-10-03: 25 mg via ORAL
  Filled 2019-10-03: qty 1

## 2019-10-03 MED ORDER — DIPHENHYDRAMINE HCL 50 MG/ML IJ SOLN
25.0000 mg | Freq: Once | INTRAMUSCULAR | Status: AC
  Administered 2019-10-03: 14:00:00 25 mg via INTRAVENOUS
  Filled 2019-10-03: qty 1

## 2019-10-03 MED ORDER — ONDANSETRON 4 MG PO TBDP: 4.0000 mg | ORAL_TABLET | Freq: Three times a day (TID) | ORAL | 0 refills | Status: DC | PRN

## 2019-10-03 MED ORDER — IOHEXOL 350 MG/ML SOLN: 80.0000 mL | Freq: Once | INTRAVENOUS | Status: DC | PRN

## 2019-10-03 MED ORDER — SODIUM CHLORIDE 0.9% FLUSH
3.0000 mL | Freq: Once | INTRAVENOUS | Status: AC
  Administered 2019-10-03: 15:00:00 3 mL via INTRAVENOUS

## 2019-10-03 NOTE — ED Notes (Signed)
Pt refusing second trop stick

## 2019-10-03 NOTE — ED Triage Notes (Signed)
Pt complains of vomiting, diarrhea, dizziness, SOB that started this morning.

## 2019-10-03 NOTE — Discharge Instructions (Addendum)
I am sending you home with a medication that helps with dizziness. Take as prescribed. You may take it up to 3 times a day. I am also sending you home with nausea medication. Please call your primary care doctor tomorrow to schedule a follow-up appointment. I have also provided you a number for a neurologist. If you dizziness continues, call and schedule an appointment for further workup. Return to the ER for new or worsening symptoms.

## 2019-10-03 NOTE — ED Notes (Addendum)
Pt refused to be stuck for troponin#2 per phlebotomy, unable to draw back from IV sites. EDP notified.

## 2019-10-03 NOTE — ED Provider Notes (Signed)
Patient handed off from [place] at shift change. See her note for full HPI.  In short, Cassandra Coffey is a 38 year old female who presents to the ED due to dizziness associated with nausea and vomiting that began suddenly this morning. Patient notes she woke up with a severe headache and began to feel dizzy and nauseous. She describes dizziness as the room spinning around her. She reports she has significantly improved throughout her ED visit.   Patient is Spanish speaking and a translator was used to obtain history.  Physical Exam  BP 114/74   Pulse (!) 104   Temp 98.2 F (36.8 C) (Oral)   Resp (!) 22   Ht 5\' 2"  (1.575 m)   Wt 74.8 kg   LMP 08/26/2019   SpO2 100%   BMI 30.18 kg/m   Physical Exam Vitals signs and nursing note reviewed.  Constitutional:      General: She is not in acute distress.    Appearance: She is not toxic-appearing.     Comments: Appear pale  HENT:     Head: Normocephalic.  Eyes:     Pupils: Pupils are equal, round, and reactive to light.  Neck:     Musculoskeletal: Neck supple.  Cardiovascular:     Rate and Rhythm: Normal rate and regular rhythm.     Pulses: Normal pulses.     Heart sounds: Normal heart sounds. No murmur. No friction rub. No gallop.   Pulmonary:     Effort: Pulmonary effort is normal.     Breath sounds: No wheezing, rhonchi or rales.  Abdominal:     General: Abdomen is flat. Bowel sounds are normal. There is no distension.     Palpations: Abdomen is soft.     Tenderness: There is no abdominal tenderness. There is no guarding.  Neurological:     General: No focal deficit present.     Mental Status: She is alert.   .Neurological:  Mental Status: Alert, oriented, thought content appropriate. Speech fluent without evidence of aphasia. Able to follow 2 step commands without difficulty.  Cranial Nerves:  III,IV, VI: Peripheral visual fields grossly normal, pupils equal, round, reactive to light. ptosis not present V,VII:  smile symmetric, facial light touch sensation equal VIII: hearing grossly normal bilaterally  IX,X: midline uvula rise  XI: bilateral shoulder shrug equal and strong XII: midline tongue extension  Motor:  5/5 in upper and lower extremities bilaterally including strong and equal grip strength and dorsiflexion/plantar flexion Sensory: light touch normal in all extremities.  Cerebellar: no pronator drift Gait: normal gait and balance   ED Course/Procedures     Procedures  MDM   38 year old female presents for evaluation of dizziness, headache, and vomiting. Interpretor used to obtain history. PA Ford placed order for CTA head/neck prior to shift change. I was informed by nurse that patient is refusing 2nd troponin and CTA scan. I spoke to patient to explain the risks of not getting the CTA and alternatives to treatment. Discussed with patient the purpose of the CTA scan is to assess for vascular abnormalities that could be causing her symptoms. Patient states understanding and still refuses the scan. I determined that the patient had the capacity to make their decision and understood the consequences of that decision. Patient admits to feeling better after the migraine cocktail and IVFs. Patient is currently chest pain free. Patient notes her headache is gone and she is able to tolerate po. Patient ambulating in ED without  difficulty. Given patient does not want further tests, will discharge patient home with symptomatic treatment with Meclizine and Zofran. Strict ED precautions discussed with patient such as worsening dizziness, inability to tolerate po, weakness, shortness of breath, chest pain. Will give patient number for neurologist for outpatient follow-up if dizziness does not improve within the next week. Patient states understanding and agrees to plan. Patient discharged home in no acute distress.    Lorelle Formosa 10/04/19 Barbette Reichmann, MD 10/04/19 [phone]

## 2019-10-03 NOTE — ED Notes (Signed)
Patient very anxious and refusing CT scan due to insurance concerns. [place], [place] in room.

## 2019-10-03 NOTE — ED Notes (Signed)
ED Provider at bedside. 

## 2019-10-03 NOTE — Progress Notes (Signed)
Telemedicine Encounter- SOAP NOTE Established Patient  This telephone encounter was conducted with the patient's (or proxy's) verbal consent via audio telecommunications: yes/no: Yes Patient was instructed to have this encounter in a suitably private space; and to only have persons present to whom they give permission to participate. In addition, patient identity was confirmed by use of name plus two identifiers (DOB and address).  I discussed the limitations, risks, security and privacy concerns of performing an evaluation and management service by telephone and the availability of in person appointments. I also discussed with the patient that there may be a patient responsible charge related to this service. The patient expressed understanding and agreed to proceed.  I spent a total of TIME; 0 MIN TO 60 MIN: 10 minutes talking with the patient or their proxy.  No chief complaint on file.  Nausea and vomiting Subjective   Cassandra Coffey is a 38 y.o. female established patient. Telephone visit today complaining of nausea, vomiting and dizziness that started this morning and has not stopped yet.  Feeling very weak.  Has a headache.  Married and sexually active.  Unknown pregnancy.  Denies fever or chills.  No flulike symptoms.  Had diarrhea earlier.  No other significant symptoms.  HPI   Patient Active Problem List   Diagnosis Date Noted  . Dyslipidemia 09/19/2019  . Iron deficiency anemia 09/19/2019  . Elevated cholesterol 09/27/2017  . Eczema 09/27/2017  . IUD (intrauterine device) in place 09/10/2015  . Mastodynia, female 05/04/2015    Past Medical History:  Diagnosis Date  . Anemia     Current Outpatient Medications  Medication Sig Dispense Refill  . Vitamin D, Ergocalciferol, (DRISDOL) 1.25 MG (50000 UT) CAPS capsule Take 1 capsule (50,000 Units total) by mouth every 7 (seven) days. 12 capsule 0  . rosuvastatin (CRESTOR) 10 MG tablet Take 1 tablet (10 mg total)  by mouth daily. (Patient not taking: Reported on 10/03/2019) 90 tablet 3   No current facility-administered medications for this visit.     No Known Allergies  Social History   Socioeconomic History  . Marital status: Married    Spouse name: Not on file  . Number of children: Not on file  . Years of education: Not on file  . Highest education level: Not on file  Occupational History  . Not on file  Social Needs  . Financial resource strain: Not on file  . Food insecurity    Worry: Not on file    Inability: Not on file  . Transportation needs    Medical: Not on file    Non-medical: Not on file  Tobacco Use  . Smoking status: Never Smoker  . Smokeless tobacco: Never Used  Substance and Sexual Activity  . Alcohol use: No    Alcohol/week: 0.0 standard drinks  . Drug use: No  . Sexual activity: Yes    Birth control/protection: Condom  Lifestyle  . Physical activity    Days per week: Not on file    Minutes per session: Not on file  . Stress: Not on file  Relationships  . Social Musician on phone: Not on file    Gets together: Not on file    Attends religious service: Not on file    Active member of club or organization: Not on file    Attends meetings of clubs or organizations: Not on file    Relationship status: Not on file  . Intimate partner violence  Fear of current or ex partner: Not on file    Emotionally abused: Not on file    Physically abused: Not on file    Forced sexual activity: Not on file  Other Topics Concern  . Not on file  Social History Narrative  . Not on file    Review of Systems  Constitutional: Positive for malaise/fatigue. Negative for chills and fever.  HENT: Negative.  Negative for congestion and sore throat.   Eyes: Negative for blurred vision and double vision.  Respiratory: Negative.  Negative for cough and shortness of breath.   Cardiovascular: Negative.  Negative for chest pain and palpitations.  Gastrointestinal:  Positive for diarrhea, nausea and vomiting. Negative for abdominal pain.  Genitourinary: Positive for dysuria.  Musculoskeletal: Positive for myalgias.  Skin: Negative.  Negative for rash.  Neurological: Positive for dizziness and headaches.  All other systems reviewed and are negative.   Objective  Alert and oriented x3 in no apparent respiratory distress. Vitals as reported by the patient: Today's Vitals   10/03/19 1004  Weight: 165 lb (74.8 kg)  Height: 5\' 2"  (1.575 m)    Diagnoses and all orders for this visit:  Intractable vomiting with nausea, unspecified vomiting type -     Novel Coronavirus, NAA ([facility])  Dizziness  General weakness    Advised to go to urgent care center now for further evaluation and treatment.   I discussed the assessment and treatment plan with the patient. The patient was provided an opportunity to ask questions and all were answered. The patient agreed with the plan and demonstrated an understanding of the instructions.   The patient was advised to call back or seek an in-person evaluation if the symptoms worsen or if the condition fails to improve as anticipated.  I provided 10 minutes of non-face-to-face time during this encounter.  Horald Pollen, MD  [facility]

## 2019-10-03 NOTE — ED Notes (Addendum)
Attempted IV access x2, 20G in L forearm successful and flushed but pt requests line be removed d/t pain; unsuccessful access in R AC. Another RN attempted in L hand, unable to obtain.

## 2019-10-03 NOTE — ED Provider Notes (Signed)
[facility] EMERGENCY DEPARTMENT Provider Note   CSN: [id number] Arrival date & time: 10/03/19  1050     History   Chief Complaint Chief Complaint  Patient presents with  . Dizziness  . Emesis  . Shortness of Breath    HPI Cassandra Coffey is a 38 y.o. female.     Cassandra Coffey is a 38 y.o. female with a history of anemia, who presents to the emergency department for evaluation of dizziness, nausea and vomiting.  Symptoms began this morning when patient woke up.  She reports that she woke up with a severe headache, it is difficult to ascertain from patient whether this headache was gradual or sudden in onset.  She reports headache has been constant and associated with dizziness described as a room spinning sensation.  Dizziness seems to be made worse with position changes.  She reports multiple episodes of nonbloody emesis.  She has had one episode of diarrhea this morning, none since then.  She denies associated abdominal pain.  Denies chest pain.  Initially reported shortness of breath but then described this more so as fatigue.  She denies any fevers or chills.  No cough or URI symptoms.  Denies urinary symptoms.  Reports she has had mild episodes of dizziness in the past but nothing this severe.  She reports headache but no associated vision changes, numbness or weakness.  She reports that she has been hesitant to try and walk due to feeling like the room is spinning and being afraid she would fall.  No medications prior to arrival to treat the symptoms.   Patient is spanish speaking, translator used to obtain history, patient is a poor historian and information is limited.     Past Medical History:  Diagnosis Date  . Anemia     Patient Active Problem List   Diagnosis Date Noted  . Dyslipidemia 09/19/2019  . Iron deficiency anemia 09/19/2019  . Elevated cholesterol 09/27/2017  . Eczema 09/27/2017  . IUD (intrauterine device) in place  09/10/2015  . Mastodynia, female 05/04/2015    History reviewed. No pertinent surgical history.   OB History    Gravida  2   Para  2   Term      Preterm      AB      Living  2     SAB      TAB      Ectopic      Multiple      Live Births               Home Medications    Prior to Admission medications   Medication Sig Start Date End Date Taking? Authorizing Provider  rosuvastatin (CRESTOR) 10 MG tablet Take 1 tablet (10 mg total) by mouth daily. Patient not taking: Reported on 10/03/2019 09/19/19   Horald Pollen, MD  Vitamin D, Ergocalciferol, (DRISDOL) 1.25 MG (50000 UT) CAPS capsule Take 1 capsule (50,000 Units total) by mouth every 7 (seven) days. 09/13/19   Princess Bruins, MD    Family History Family History  Problem Relation Age of Onset  . Cancer Maternal Aunt        OVARIAN  . Breast cancer Maternal Aunt     Social History Social History   Tobacco Use  . Smoking status: Never Smoker  . Smokeless tobacco: Never Used  Substance Use Topics  . Alcohol use: No    Alcohol/week: 0.0 standard drinks  . Drug use: No  Allergies   Patient has no known allergies.   Review of Systems Review of Systems  Constitutional: Positive for fatigue. Negative for chills and fever.  HENT: Negative.   Eyes: Negative for visual disturbance.  Respiratory: Negative for cough and shortness of breath.   Cardiovascular: Negative for chest pain.  Gastrointestinal: Positive for diarrhea, nausea and vomiting. Negative for abdominal pain and blood in stool.  Genitourinary: Negative for dysuria.  Musculoskeletal: Negative for arthralgias and myalgias.  Skin: Negative for color change and rash.  Neurological: Positive for dizziness and headaches. Negative for syncope, facial asymmetry, speech difficulty, weakness, light-headedness and numbness.     Physical Exam Updated Vital Signs BP 121/79 (BP Location: Right Arm)   Pulse 89   Temp 98.2 F  (36.8 C) (Oral)   Resp 16   Ht  (1.575 m)   Wt 74.8 kg   LMP 08/26/2019   SpO2 100%   BMI 30.18 kg/m   Physical Exam Vitals signs and nursing note reviewed.  Constitutional:      General: She is not in acute distress.    Appearance: She is well-developed. She is ill-appearing. She is not diaphoretic.     Comments: Patient somewhat pale and ill-appearing on arrival, actively vomiting.  HENT:     Head: Normocephalic and atraumatic.     Mouth/Throat:     Mouth: Mucous membranes are moist.     Pharynx: Oropharynx is clear.  Eyes:     General:        Right eye: No discharge.        Left eye: No discharge.     Extraocular Movements: Extraocular movements intact.     Pupils: Pupils are equal, round, and reactive to light.     Comments: No nystagmus noted.  Neck:     Musculoskeletal: Neck supple.  Cardiovascular:     Rate and Rhythm: Normal rate and regular rhythm.     Heart sounds: Normal heart sounds. No murmur. No friction rub. No gallop.   Pulmonary:     Effort: Pulmonary effort is normal. No respiratory distress.     Breath sounds: Normal breath sounds. No wheezing or rales.     Comments: Respirations equal and unlabored, patient able to speak in full sentences, lungs clear to auscultation bilaterally Abdominal:     General: Bowel sounds are normal. There is no distension.     Palpations: Abdomen is soft. There is no mass.     Tenderness: There is no abdominal tenderness. There is no guarding.     Comments: Abdomen soft, nondistended, nontender to palpation in all quadrants without guarding or peritoneal signs  Musculoskeletal:        General: No deformity.     Right lower leg: She exhibits no tenderness. No edema.     Left lower leg: She exhibits no tenderness. No edema.  Skin:    General: Skin is warm and dry.     Capillary Refill: Capillary refill takes less than 2 seconds.     Coloration: Skin is pale.  Neurological:     Mental Status: She is alert.      Coordination: Coordination normal.     Comments: Speech is clear, able to follow commands CN III-XII intact Normal strength in upper and lower extremities bilaterally including dorsiflexion and plantar flexion, strong and equal grip strength Sensation normal to light and sharp touch Moves extremities without ataxia, coordination intact Normal finger to nose and rapid alternating movements No pronator drift  Psychiatric:        Mood and Affect: Mood is anxious.        Behavior: Behavior normal.      ED Treatments / Results  Labs (all labs ordered are listed, but only abnormal results are displayed) Labs Reviewed  LACTIC ACID, PLASMA - Abnormal; Notable for the following components:      Result Value   Lactic Acid, Venous 2.1 (*)    All other components within normal limits  COMPREHENSIVE METABOLIC PANEL - Abnormal; Notable for the following components:   CO2 20 (*)    Glucose, Bld 140 (*)    All other components within normal limits  CBC WITH DIFFERENTIAL/PLATELET - Abnormal; Notable for the following components:   WBC 14.7 (*)    Hemoglobin 9.6 (*)    HCT 32.2 (*)    MCV 73.3 (*)    MCH 21.9 (*)    MCHC 29.8 (*)    RDW 16.2 (*)    Platelets 477 (*)    Neutro Abs 12.0 (*)    Abs Immature Granulocytes 0.08 (*)    All other components within normal limits  SARS CORONAVIRUS 2 (TAT 6-24 HRS)  LACTIC ACID, PLASMA  URINALYSIS, ROUTINE W REFLEX MICROSCOPIC  I-STAT BETA HCG BLOOD, ED ([facility], [facility], [facility] ONLY)  TROPONIN I (HIGH SENSITIVITY)  TROPONIN I (HIGH SENSITIVITY)    EKG EKG Interpretation  Date/Time:  Thursday October 03 2019 11:05:58 EST Ventricular Rate:  87 PR Interval:  132 QRS Duration: 84 QT Interval:  360 QTC Calculation: 433 R Axis:   75 Text Interpretation: Normal sinus rhythm Normal ECG No old tracing to compare Confirmed by Linwood DibblesKnapp, Jon [phone]) on 10/03/2019 4:35:03 PM   Radiology Dg Chest Port 1 View  Result Date: 10/03/2019 CLINICAL DATA:  Shortness of  breath, dizzy and vomiting EXAM: PORTABLE CHEST 1 VIEW COMPARISON:  None. FINDINGS: The heart size and mediastinal contours are within normal limits. Both lungs are clear. The visualized skeletal structures are unremarkable. IMPRESSION: No active disease. Electronically Signed   By: Jasmine PangKim  Fujinaga M.D.   On: 10/03/2019 14:24    Procedures Procedures (including critical care time)  Medications Ordered in ED Medications  sodium chloride flush (NS) 0.9 % injection 3 mL (3 mLs Intravenous Given 10/03/19 1430)  prochlorperazine (COMPAZINE) injection 10 mg (10 mg Intravenous Given 10/03/19 1417)  diphenhydrAMINE (BENADRYL) injection 25 mg (25 mg Intravenous Given 10/03/19 1418)  sodium chloride 0.9 % bolus 1,000 mL (0 mLs Intravenous Stopped 10/03/19 1535)  meclizine (ANTIVERT) tablet 25 mg (25 mg Oral Given 10/03/19 1533)     Initial Impression / Assessment and Plan / ED Course  I have reviewed the triage vital signs and the nursing notes.  Pertinent labs & imaging results that were available during my care of the patient were reviewed by me and considered in my medical decision making (see chart for details).  38 year old female presents with dizziness, headache vomiting.  Patient initially reported some chest pain but then later denied this, initially described shortness of breath but then later described this as fatigue.  History is limited.  Interpreter used to obtain history.  In the setting of dizziness, headache and vomiting concern for potential subarachnoid hemorrhage, ICH or posterior circulation stroke, patient does not have focal cerebellar signs on neuro exam.  On arrival she is actively vomiting and is somewhat pale and ill-appearing but with stable vitals.  Will get CTA of the head and neck as well as labs, chest x-ray,  EKG and troponins.  Patient given IV fluids and headache cocktail.  Lab work significant for leukocytosis of 14.7, hemoglobin is stable when compared to previous at 9.6,  patient without significant electrolyte derangements, normal renal and liver function and no anion gap.  Initial lactic acid which was ordered from triage is negative.  Urinalysis without signs of infection.  Negative pregnancy.  Troponin negative and EKG without ischemic changes.  Chest x-ray is clear.  Covid swab is pending.  At shift change CTA of the head and neck pending, care signed out to PA [facility] cheek who will follow-up on CTA results if normal and patient continues to experience dizziness headache and vomiting would consider MRI to rule out posterior circulation stroke although patient is not high risk for this.  Patient will need to ambulate and be p.o. challenge prior to any discharge.  Patient discussed with Dr. Manus Gunning, who saw patient as well and agrees with plan.   Final Clinical Impressions(s) / ED Diagnoses   Final diagnoses:  Dizziness  Acute nonintractable headache, unspecified headache type  Non-intractable vomiting with nausea, unspecified vomiting type    ED Discharge Orders    None       Legrand Rams 10/03/19 1737    Glynn Octave, MD 10/03/19 1929

## 2019-10-03 NOTE — Progress Notes (Signed)
Called patient using interpreter [phone]. Patient states this morning about 7:00 she was dizzy, vomiting and nausea, headache, dry tongue, diarrhea twice. Patient denies any fever.

## 2019-10-03 NOTE — ED Notes (Signed)
Called transport in an attempt to get pt over for CTA. Pt agreed. Upon transport arriving pt began to become agitated and said she did not want to have scan. PA advised, now at bedside

## 2019-10-07 ENCOUNTER — Encounter: Payer: Self-pay | Admitting: Emergency Medicine

## 2019-10-07 ENCOUNTER — Other Ambulatory Visit: Payer: Self-pay

## 2019-10-07 ENCOUNTER — Ambulatory Visit (INDEPENDENT_AMBULATORY_CARE_PROVIDER_SITE_OTHER): Payer: BC Managed Care – PPO | Admitting: Emergency Medicine

## 2019-10-07 VITALS — BP 117/76 | HR 97 | Temp 98.9°F | Resp 16 | Ht 62.0 in | Wt 167.4 lb

## 2019-10-07 DIAGNOSIS — B349 Viral infection, unspecified: Secondary | ICD-10-CM | POA: Diagnosis not present

## 2019-10-07 DIAGNOSIS — R42 Dizziness and giddiness: Secondary | ICD-10-CM | POA: Diagnosis not present

## 2019-10-07 NOTE — Patient Instructions (Addendum)
   If you have lab work done today you will be contacted with your lab results within the next 2 weeks.  If you have not heard from us then please contact us. The fastest way to get your results is to register for My Chart.   IF you received an x-ray today, you will receive an invoice from [facility]. Please contact [facility] at [phone] with questions or concerns regarding your invoice.   IF you received labwork today, you will receive an invoice from [facility]. Please contact [facility] at [phone] with questions or concerns regarding your invoice.   Our billing staff will not be able to assist you with questions regarding bills from these companies.  You will be contacted with the lab results as soon as they are available. The fastest way to get your results is to activate your My Chart account. Instructions are located on the last page of this paperwork. If you have not heard from us regarding the results in 2 weeks, please contact this office.     Mantenimiento de la salud en las mujeres Health Maintenance, Female Adoptar un estilo de vida saludable y recibir atencin preventiva son importantes para promover la salud y el bienestar. Consulte al mdico sobre:  El esquema adecuado para hacerse pruebas y exmenes peridicos.  Cosas que puede hacer por su cuenta para prevenir enfermedades y mantenerse sana. Qu debo saber sobre la dieta, el peso y el ejercicio? Consuma una dieta saludable   Consuma una dieta que incluya muchas verduras, frutas, productos lcteos con bajo contenido de grasa y protenas magras.  No consuma muchos alimentos ricos en grasas slidas, azcares agregados o sodio. Mantenga un peso saludable El ndice de masa muscular (IMC) se utiliza para identificar problemas de peso. Proporciona una estimacin de la grasa corporal basndose en el peso y la altura. Su mdico puede ayudarle a determinar su IMC y a lograr o mantener un peso  saludable. Haga ejercicio con regularidad Haga ejercicio con regularidad. Esta es una de las prcticas ms importantes que puede hacer por su salud. La mayora de los adultos deben seguir estas pautas:  Realizar, al menos, 150minutos de actividad fsica por semana. El ejercicio debe aumentar la frecuencia cardaca y hacerlo transpirar (ejercicio de intensidad moderada).  Hacer ejercicios de fortalecimiento por lo menos dos veces por semana. Agregue esto a su plan de ejercicio de intensidad moderada.  Pasar menos tiempo sentados. Incluso la actividad fsica ligera puede ser beneficiosa. Controle sus niveles de colesterol y lpidos en la sangre Comience a realizarse anlisis de lpidos y colesterol en la sangre a los 20aos y luego reptalos cada 5aos. Hgase controlar los niveles de colesterol con mayor frecuencia si:  Sus niveles de lpidos y colesterol son altos.  Es mayor de 40aos.  Presenta un alto riesgo de padecer enfermedades cardacas. Qu debo saber sobre las pruebas de deteccin del cncer? Segn su historia clnica y sus antecedentes familiares, es posible que deba realizarse pruebas de deteccin del cncer en diferentes edades. Esto puede incluir pruebas de deteccin de lo siguiente:  Cncer de mama.  Cncer de cuello uterino.  Cncer colorrectal.  Cncer de piel.  Cncer de pulmn. Qu debo saber sobre la enfermedad cardaca, la diabetes y la hipertensin arterial? Presin arterial y enfermedad cardaca  La hipertensin arterial causa enfermedades cardacas y aumenta el riesgo de accidente cerebrovascular. Es ms probable que esto se manifieste en las personas que tienen lecturas de presin arterial alta, tienen ascendencia africana o   tienen sobrepeso.  Hgase controlar la presin arterial: ? Cada 3 a 5 aos si tiene entre 18 y 39 aos. ? Todos los aos si es mayor de 40aos. Diabetes Realcese exmenes de deteccin de la diabetes con regularidad. Este  anlisis revisa el nivel de azcar en la sangre en ayunas. Hgase las pruebas de deteccin:  Cada tresaos despus de los 40aos de edad si tiene un peso normal y un bajo riesgo de padecer diabetes.  Con ms frecuencia y a partir de una edad inferior si tiene sobrepeso o un alto riesgo de padecer diabetes. Qu debo saber sobre la prevencin de infecciones? Hepatitis B Si tiene un riesgo ms alto de contraer hepatitis B, debe someterse a un examen de deteccin de este virus. Hable con el mdico para averiguar si tiene riesgo de contraer la infeccin por hepatitis B. Hepatitis C Se recomienda el anlisis a:  Todos los que nacieron entre 1945 y 1965.  Todas las personas que tengan un riesgo de haber contrado hepatitis C. Enfermedades de transmisin sexual (ETS)  Hgase las pruebas de deteccin de ITS, incluidas la gonorrea y la clamidia, si: ? Es sexualmente activa y es menor de 24aos. ? Es mayor de 24aos, y el mdico le informa que corre riesgo de tener este tipo de infecciones. ? La actividad sexual ha cambiado desde que le hicieron la ltima prueba de deteccin y tiene un riesgo mayor de tener clamidia o gonorrea. Pregntele al mdico si usted tiene riesgo.  Pregntele al mdico si usted tiene un alto riesgo de contraer VIH. El mdico tambin puede recomendarle un medicamento recetado para ayudar a evitar la infeccin por el VIH. Si elige tomar medicamentos para prevenir el VIH, primero debe hacerse los anlisis de deteccin del VIH. Luego debe hacerse anlisis cada 3meses mientras est tomando los medicamentos. Embarazo  Si est por dejar de menstruar (fase premenopusica) y usted puede quedar embarazada, busque asesoramiento antes de quedar embarazada.  Tome de 400 a 800microgramos (mcg) de cido flico todos los das si queda embarazada.  Pida mtodos de control de la natalidad (anticonceptivos) si desea evitar un embarazo no deseado. Osteoporosis y menopausia La  osteoporosis es una enfermedad en la que los huesos pierden los minerales y la fuerza por el avance de la edad. El resultado pueden ser fracturas en los huesos. Si tiene 65aos o ms, o si est en riesgo de sufrir osteoporosis y fracturas, pregunte a su mdico si debe:  Hacerse pruebas de deteccin de prdida sea.  Tomar un suplemento de calcio o de vitamina D para reducir el riesgo de fracturas.  Recibir terapia de reemplazo hormonal (TRH) para tratar los sntomas de la menopausia. Siga estas instrucciones en su casa: Estilo de vida  No consuma ningn producto que contenga nicotina o tabaco, como cigarrillos, cigarrillos electrnicos y tabaco de mascar. Si necesita ayuda para dejar de fumar, consulte al mdico.  No consuma drogas.  No comparta agujas.  Solicite ayuda a su mdico si necesita apoyo o informacin para abandonar las drogas. Consumo de alcohol  No beba alcohol si: ? Su mdico le indica no hacerlo. ? Est embarazada, puede estar embarazada o est tratando de quedar embarazada.  Si bebe alcohol: ? Limite la cantidad que consume de 0 a 1 medida por da. ? Limite la ingesta si est amamantando.  Est atento a la cantidad de alcohol que hay en las bebidas que toma. En [place], una medida equivale a una botella de cerveza de 12oz (355ml),   un vaso de vino de 5oz (148ml) o un vaso de una bebida alcohlica de alta graduacin de 1oz (44ml). Instrucciones generales  Realcese los estudios de rutina de la salud, dentales y de la vista.  Mantngase al da con las vacunas.  Infrmele a su mdico si: ? Se siente deprimida con frecuencia. ? Alguna vez ha sido vctima de maltrato o no se siente segura en su casa. Resumen  Adoptar un estilo de vida saludable y recibir atencin preventiva son importantes para promover la salud y el bienestar.  Siga las instrucciones del mdico acerca de una dieta saludable, el ejercicio y la realizacin de pruebas o exmenes  para detectar enfermedades.  Siga las instrucciones del mdico con respecto al control del colesterol y la presin arterial. Esta informacin no tiene como fin reemplazar el consejo del mdico. Asegrese de hacerle al mdico cualquier pregunta que tenga. Document Released: 11/03/2011 Document Revised: 12/05/2018 Document Reviewed: 12/05/2018 Elsevier Patient Education  2020 [employer]  

## 2019-10-07 NOTE — Progress Notes (Signed)
Cassandra Coffey 38 y.o.   Chief Complaint  Patient presents with  . Dizziness    follow up from ED visit    HISTORY OF PRESENT ILLNESS: This is a 38 y.o. female here for follow-up of ED visit on 09/02/2019 when she was seen and evaluated for severe vertigo and vomiting with dehydration.  Was given IV fluids and antiemetics with excellent results.  Had chest x-ray and EKG within normal limits.  Blood work reviewed with patient, unremarkable results.  Negative coronavirus test.  Negative pregnancy test and urinalysis.  Felt much better after treatment.  Asymptomatic today. Has history of dyslipidemia, recently prescribed Crestor 10 mg daily, but too expensive so she has not started taking it. No other complaints or medical concerns today.  HPI   Prior to Admission medications   Medication Sig Start Date End Date Taking? Authorizing Provider  Vitamin D, Ergocalciferol, (DRISDOL) 1.25 MG (50000 UT) CAPS capsule Take 1 capsule (50,000 Units total) by mouth every 7 (seven) days. 09/13/19  Yes Cassandra Bruins, MD  meclizine (ANTIVERT) 12.5 MG tablet Take 1 tablet (12.5 mg total) by mouth 3 (three) times daily as needed for dizziness. Patient not taking: Reported on 10/07/2019 10/03/19   Cassandra Coffey  ondansetron (ZOFRAN ODT) 4 MG disintegrating tablet Take 1 tablet (4 mg total) by mouth every 8 (eight) hours as needed for nausea or vomiting. 10/03/19   Cassandra Coffey, Cassandra Racer Coffey, Coffey  rosuvastatin (CRESTOR) 10 MG tablet Take 1 tablet (10 mg total) by mouth daily. Patient not taking: Reported on 10/03/2019 09/19/19   Cassandra Pollen, MD    No Known Allergies  Patient Active Problem List   Diagnosis Date Noted  . Dyslipidemia 09/19/2019  . Iron deficiency anemia 09/19/2019  . Elevated cholesterol 09/27/2017    Past Medical History:  Diagnosis Date  . Anemia     History reviewed. No pertinent surgical history.  Social History   Socioeconomic History  . Marital  status: Married    Spouse name: Not on file  . Number of children: Not on file  . Years of education: Not on file  . Highest education level: Not on file  Occupational History  . Not on file  Social Needs  . Financial resource strain: Not on file  . Food insecurity    Worry: Not on file    Inability: Not on file  . Transportation needs    Medical: Not on file    Non-medical: Not on file  Tobacco Use  . Smoking status: Never Smoker  . Smokeless tobacco: Never Used  Substance and Sexual Activity  . Alcohol use: No    Alcohol/week: 0.0 standard drinks  . Drug use: No  . Sexual activity: Yes    Birth control/protection: Condom  Lifestyle  . Physical activity    Days per week: Not on file    Minutes per session: Not on file  . Stress: Not on file  Relationships  . Social Herbalist on phone: Not on file    Gets together: Not on file    Attends religious service: Not on file    Active member of club or organization: Not on file    Attends meetings of clubs or organizations: Not on file    Relationship status: Not on file  . Intimate partner violence    Fear of current or ex partner: Not on file    Emotionally abused: Not on file    Physically abused: Not  on file    Forced sexual activity: Not on file  Other Topics Concern  . Not on file  Social History Narrative  . Not on file    Family History  Problem Relation Age of Onset  . Cancer Maternal Aunt        OVARIAN  . Breast cancer Maternal Aunt      Review of Systems  Constitutional: Negative.  Negative for chills and fever.  HENT: Negative.  Negative for congestion and sore throat.   Eyes: Negative.  Negative for blurred vision and double vision.  Respiratory: Negative.  Negative for cough and shortness of breath.   Cardiovascular: Negative.  Negative for chest pain and palpitations.  Gastrointestinal: Negative.  Negative for abdominal pain, diarrhea, nausea and vomiting.  Genitourinary: Negative.   Negative for dysuria, frequency, hematuria and urgency.  Musculoskeletal: Negative.  Negative for myalgias and neck pain.  Skin: Negative.  Negative for rash.  Neurological: Negative.  Negative for dizziness and headaches.  Endo/Heme/Allergies: Negative.   All other systems reviewed and are negative.   Vitals:   10/07/19 1543  BP: 117/76  Pulse: 97  Resp: 16  Temp: 98.9 F (37.2 C)  SpO2: 98%    Physical Exam Vitals signs reviewed.  Constitutional:      Appearance: Normal appearance.  HENT:     Head: Normocephalic.     Mouth/Throat:     Mouth: Mucous membranes are moist.     Pharynx: Oropharynx is clear.  Eyes:     Extraocular Movements: Extraocular movements intact.     Conjunctiva/sclera: Conjunctivae normal.     Pupils: Pupils are equal, round, and reactive to light.  Neck:     Musculoskeletal: Normal range of motion and neck supple.  Cardiovascular:     Rate and Rhythm: Normal rate and regular rhythm.     Pulses: Normal pulses.     Heart sounds: Normal heart sounds.  Pulmonary:     Effort: Pulmonary effort is normal.     Breath sounds: Normal breath sounds.  Abdominal:     General: There is no distension.     Palpations: Abdomen is soft.     Tenderness: There is no abdominal tenderness.  Musculoskeletal: Normal range of motion.        General: No tenderness or deformity.  Skin:    General: Skin is warm and dry.     Capillary Refill: Capillary refill takes less than 2 seconds.  Neurological:     General: No focal deficit present.     Mental Status: She is alert and oriented to person, place, and time.     Sensory: No sensory deficit.     Motor: No weakness.     Coordination: Coordination normal.     Gait: Gait normal.  Psychiatric:        Mood and Affect: Mood normal.        Behavior: Behavior normal.     A total of 25 minutes was spent in the room with the patient, greater than 50% of which was in counseling/coordination of care regarding differential  diagnosis, medical record reviewed, emergency department visit reviewed, blood results, EKG, chest x-ray review, medication reviewed, prognosis and follow-up as needed.   ASSESSMENT & PLAN: Cassandra Coffey was seen today for dizziness.  Diagnoses and all orders for this visit:  Viral illness  Vertigo Comments: Resolved    Patient Instructions       If you have lab work done today you will be contacted  with your lab results within the next 2 weeks.  If you have not heard from [place] then please contact us. The fastest way to get your results is to register for My Chart.   IF you received an x-ray today, you will receive an invoice from [facility]. Please contact [facility] at [phone] with questions or concerns regarding your invoice.   IF you received labwork today, you will receive an invoice from [place]. Please contact [facility] at [phone] with questions or concerns regarding your invoice.   Our billing staff will not be able to assist you with questions regarding bills from these companies.  You will be contacted with the lab results as soon as they are available. The fastest way to get your results is to activate your My Chart account. Instructions are located on the last page of this paperwork. If you have not heard from [place] regarding the results in 2 weeks, please contact this office.     Mantenimiento de Radiographer, therapeutic en las mujeres Health Maintenance, Female Adoptar un estilo de vida saludable y recibir atencin preventiva son importantes para promover la salud y Counsellor. Consulte al mdico sobre:  El esquema adecuado para hacerse pruebas y exmenes peridicos.  Cosas que puede hacer por su cuenta para prevenir enfermedades y [employer]. Qu debo saber sobre la dieta, el peso y el ejercicio? Consuma una dieta saludable   Consuma una dieta que incluya muchas verduras, frutas, productos lcteos con bajo contenido de [place] y Associate Professor.   No consuma muchos alimentos ricos en grasas slidas, azcares agregados o sodio. Mantenga un peso saludable El ndice de masa muscular [facility]) se [place] para identificar problemas de [place]. Proporciona una estimacin de la grasa corporal basndose en el peso y la altura. Su mdico puede ayudarle a Engineer, site IMC y a Personnel officer o Pharmacologist un peso saludable. Haga ejercicio con regularidad Haga ejercicio con regularidad. Esta es una de las prcticas ms importantes que puede hacer por su salud. La mayora de los adultos deben seguir estas pautas:  Education officer, environmental, al menos, de actividad fsica por semana. El ejercicio debe aumentar la frecuencia cardaca y Media planner transpirar (ejercicio de intensidad moderada).  Hacer ejercicios de fortalecimiento por lo [employer] por semana. Agregue esto a su plan de ejercicio de intensidad moderada.  Pasar menos tiempo sentados. Incluso la actividad fsica ligera puede ser beneficiosa. Controle sus niveles de colesterol y lpidos en la sangre Comience a realizarse anlisis de lpidos y Oncologist en la sangre a los 20aos y luego reptalos cada 5aos. Hgase controlar los niveles de colesterol con mayor frecuencia si:  Sus niveles de lpidos y colesterol son altos.  Es mayor de 40aos.  Presenta un alto riesgo de padecer enfermedades cardacas. Qu debo saber sobre las pruebas de deteccin del cncer? Segn su historia clnica y sus antecedentes familiares, es posible que deba realizarse pruebas de deteccin del cncer en diferentes edades. Esto puede incluir pruebas de deteccin de lo siguiente:  Cncer de mama.  Cncer de cuello uterino.  Cncer colorrectal.  Cncer de piel.  Cncer de pulmn. Qu debo saber sobre la enfermedad cardaca, la diabetes y la hipertensin arterial? Presin arterial y enfermedad cardaca  La hipertensin arterial causa enfermedades cardacas y [place] el riesgo de accidente cerebrovascular. Es ms  probable que esto se manifieste en las personas que tienen lecturas de presin arterial alta, tienen ascendencia africana o tienen sobrepeso.  Hgase controlar la presin arterial: ? Cada 3 a 5  aos si tiene entre 18 y 80 aos. ? Todos los aos si es mayor de [place]. Diabetes Realcese exmenes de deteccin de la diabetes con regularidad. Este anlisis revisa el nivel de azcar en la sangre en [address]. Hgase las pruebas de deteccin:  Cada tresaos despus de los 40aos de edad si tiene un peso normal y un bajo riesgo de padecer diabetes.  Con ms frecuencia y a partir de [place] edad inferior si tiene sobrepeso o un alto riesgo de padecer diabetes. Qu debo saber sobre la prevencin de infecciones? Hepatitis Coffey Si tiene un riesgo ms alto de contraer hepatitis Coffey, debe someterse a un examen de deteccin de este virus. Hable con el mdico para averiguar si tiene riesgo de contraer la infeccin por hepatitis Coffey. Hepatitis C Se recomienda el anlisis a:  [employer] 1945 y 1965.  Todas las personas que tengan un riesgo de haber contrado hepatitis C. Enfermedades de transmisin sexual (ETS)  Hgase las pruebas de Airline pilot de ITS, incluidas la gonorrea y la clamidia, si: ? Es sexualmente activa y es menor de [place]. ? Es mayor de 24aos, y Public affairs consultant informa que corre riesgo de tener este tipo de infecciones. ? La actividad sexual ha cambiado desde que le hicieron la ltima prueba de deteccin y tiene un riesgo mayor de Warehouse manager clamidia o Copy. Pregntele al mdico si usted tiene riesgo.  Pregntele al mdico si usted tiene un alto riesgo de Primary school teacher VIH. El mdico tambin puede recomendarle un medicamento recetado para ayudar a evitar la infeccin por el VIH. Si elige tomar medicamentos para prevenir el VIH, primero debe [employer] de deteccin del VIH. Luego debe hacerse anlisis cada mientras est tomando los medicamentos. Embarazo  Si est por  dejar de Armed forces training and education officer (fase premenopusica) y usted puede quedar [place], busque asesoramiento antes de [place].  Tome de 400 a (mcg) de cido [employer] si [place].  Pida mtodos de control de la natalidad (anticonceptivos) si desea evitar un embarazo no deseado. Osteoporosis y [place] La osteoporosis es una enfermedad en la que los huesos pierden los minerales y la fuerza por el avance de la edad. El resultado pueden ser fracturas en los [place]. Si tiene 65aos o ms, o si est en riesgo de sufrir osteoporosis y fracturas, pregunte a su mdico si debe:  Hacerse pruebas de deteccin de prdida sea.  Tomar un suplemento de calcio o de vitamina D para reducir el riesgo de fracturas.  Recibir terapia de reemplazo hormonal (TRH) para tratar los sntomas de la menopausia. Siga estas instrucciones en su casa: Estilo de vida  No consuma ningn producto que contenga nicotina o tabaco, como cigarrillos, cigarrillos electrnicos y tabaco de Theatre manager. Si necesita ayuda para dejar de fumar, consulte al mdico.  No consuma drogas.  No comparta agujas.  Solicite ayuda a su mdico si necesita apoyo o informacin para abandonar las drogas. Consumo de alcohol  No beba alcohol si: ? Su mdico le indica no hacerlo. ? Est embarazada, puede estar embarazada o est tratando de quedar embarazada.  Si bebe alcohol: ? Limite la cantidad que consume de 0 a 1 medida por da. ? Limite la ingesta si est amamantando.  Est atento a la cantidad de alcohol que hay en las bebidas que toma. En los [place], una medida equivale a una botella de cerveza de 12oz ( ), un vaso de vino de 5oz ( ) o un vaso de Neomia Dear bebida alcohlica  de alta graduacin de 1oz (44ml). Instrucciones generales  Realcese los estudios de rutina de la salud, dentales y de Wellsite geologist.  Mantngase al da con las vacunas.  Infrmele a su mdico si: ? Se siente deprimida  con frecuencia. ? Alguna vez ha sido vctima de [place] o no se siente segura en su casa. Resumen  Adoptar un estilo de vida saludable y recibir atencin preventiva son importantes para promover la salud y Counsellor.  Siga las instrucciones del mdico acerca de una dieta saludable, el ejercicio y la realizacin de pruebas o exmenes para Hotel manager.  Siga las instrucciones del mdico con respecto al control del colesterol y la presin arterial. Esta informacin no tiene Theme park manager el consejo del mdico. Asegrese de hacerle al mdico cualquier pregunta que tenga. Document Released: 11/03/2011 Document Revised: 12/05/2018 Document Reviewed: 12/05/2018 Elsevier Patient Education  2020 Elsevier Inc.      Edwina Barth, MD [facility]

## 2019-10-17 ENCOUNTER — Telehealth: Payer: Self-pay | Admitting: Emergency Medicine

## 2019-10-17 NOTE — Telephone Encounter (Signed)
PT DROPPED OFF WELLNESS PAPERWORK TO BE COMPLETED BY PROVIDER. [place] PLACED IN PROVIDER BOX AT [place]

## 2019-10-22 ENCOUNTER — Telehealth: Payer: Self-pay | Admitting: *Deleted

## 2019-10-22 NOTE — Telephone Encounter (Signed)
The patient left a Health Screening form to be completed for a physical. The patient's office visit on 10/07/2019 was a follow up because of a viral illness. On 10/03/2019, a telemedicine and the patient was told to go to the ED because of Intractable vomiting with nausea and dizziness. I will advise Dr Mitchel Honour and future lab only orders will be in the system. Patient  mobile number is not in the [place].

## 2019-11-04 ENCOUNTER — Other Ambulatory Visit: Payer: Self-pay

## 2019-11-04 ENCOUNTER — Ambulatory Visit (INDEPENDENT_AMBULATORY_CARE_PROVIDER_SITE_OTHER): Payer: BC Managed Care – PPO | Admitting: Emergency Medicine

## 2019-11-04 ENCOUNTER — Encounter: Payer: Self-pay | Admitting: Emergency Medicine

## 2019-11-04 VITALS — BP 110/75 | HR 87 | Temp 98.3°F | Resp 16 | Ht 62.0 in | Wt 165.8 lb

## 2019-11-04 DIAGNOSIS — R590 Localized enlarged lymph nodes: Secondary | ICD-10-CM

## 2019-11-04 MED ORDER — AZITHROMYCIN 250 MG PO TABS: ORAL_TABLET | ORAL | 0 refills | Status: DC

## 2019-11-04 NOTE — Patient Instructions (Addendum)
If you have lab work done today you will be contacted with your lab results within the next 2 weeks.  If you have not heard from [place] then please contact us. The fastest way to get your results is to register for My Chart.   IF you received an x-ray today, you will receive an invoice from [facility]. Please contact [facility] Radiology at [phone] with questions or concerns regarding your invoice.   IF you received labwork today, you will receive an invoice from [place]. Please contact [facility] at [phone] with questions or concerns regarding your invoice.   Our billing staff will not be able to assist you with questions regarding bills from these companies.  You will be contacted with the lab results as soon as they are available. The fastest way to get your results is to activate your My Chart account. Instructions are located on the last page of this paperwork. If you have not heard from [place] regarding the results in 2 weeks, please contact this office.     Linfadenopata Lymphadenopathy  El trmino linfadenopata significa que los ganglios linfticos estn inflamados o son ms grandes que lo normal (estn agrandados). Los ganglios linfticos, tambin llamados ndulos linfticos, son grupos de tejido que filtran bacterias, virus y sustancias de desecho del torrente sanguneo. Forman parte del sistema de defensa de enfermedades del cuerpo (sistema inmunitario) que protege al cuerpo contra los grmenes. La linfadenopata puede tener diferentes causas, dependiendo del lugar del cuerpo en donde se encuentra. Algunos tipos desaparecen por s solos. La linfadenopata puede producirse en cualquier lugar que tenga ganglios linfticos, incluidas las siguientes reas:  Cuello (linfadenopata cervical).  Pecho (linfadenopata mediastinal).  Pulmones (linfadenopata hiliar).  Axilas (linfadenopata axilar).  Ingle (linfadenopata inguinal). Cuando el sistema inmunitario  responde a los microbios, se produce una acumulacin de lquido y clulas en los ganglios linfticos. Esto produce hinchazn y agrandamiento. Si los ganglios linfticos no vuelven a la normalidad despus de haber tenido una infeccin o enfermedad, el mdico puede realizar [employer]. Estas pruebas ayudarn a Scientist, physiological enfermedad y Warehouse manager motivo por el cual los ganglios an estn hinchados y [place]. Siga estas indicaciones en su casa:  Descanse lo suficiente.  Tome los medicamentos de venta libre y los recetados solamente como se lo haya indicado el mdico. El mdico puede recomendarle medicamentos de venta libre para Chief Technology Officer.  Si se lo indican, aplique calor en la zona de los ganglios linfticos con la frecuencia que le haya indicado el mdico. Use la fuente de calor que el mdico le recomiende, como una compresa de calor hmedo o una almohadilla trmica. ? Coloque una [employer] piel y la fuente de Airline pilot. ? Aplique el calor durante 20 a . ? Retire la fuente de calor si la piel se pone de color rojo brillante. Esto es muy importante si no puede Financial risk analyst, calor o fro. Puede correr un riesgo mayor de sufrir quemaduras.  Controle los ganglios linfticos afectados todos los das para Armed forces logistics/support/administrative officer. Controle otras reas de ganglios linfticos como se lo haya indicado el mdico. Controle si presenta cambios como: ? Aumento de la hinchazn. ? Sbito aumento del tamao. ? Enrojecimiento o dolor. ? Dureza.  Concurra a todas las visitas de [address] se lo haya indicado el mdico. Esto es importante. Comunquese con un mdico si tiene:  Una [employer] o se extiende a Administrator, sports.  Problemas para respirar.  Los ganglios linfticos: ? Todava estn  hinchados despus de 2 semanas. ? Han aumentado de tamao repentinamente. ? Estn rojos o duros, o Secretary/administratorle duelen.  Fiebre o escalofros.  Fatiga.  Dolor de Advertising copywritergarganta.  Dolor en el  abdomen.  Prdida de peso.  Sudoracin nocturna. Pida ayuda inmediatamente si tiene:  Lquido que supura de un ganglio linftico agrandado.  Dolor intenso.  Dolor en el pecho.  Falta de aire. Resumen  El trmino linfadenopata significa que los ganglios linfticos estn inflamados o son ms grandes que lo normal (estn agrandados).  Los ganglios linfticos (tambin llamados ndulos linfticos) son grupos de tejido que filtran bacterias, virus y sustancias de desecho del torrente sanguneo. Sherron MondayForman parte del sistema de defensa del cuerpo (sistema inmunitario).  La linfadenopata puede producirse en cualquier lugar que tenga ganglios linfticos.  Si los ganglios linfticos agrandados e hinchados no vuelven a la normalidad despus de una infeccin o una enfermedad, el mdico puede hacerle pruebas para Chief Operating Officercontrolar su afeccin y Oceanographeraveriguar el motivo por el cual los ganglios an estn hinchados y Engineer, civil (consulting)agrandados.  Controle los ganglios linfticos afectados todos los das para Armed forces logistics/support/administrative officerdetectar cambios. Controle otras reas de ganglios linfticos como se lo haya indicado el mdico. Esta informacin no tiene Theme park managercomo fin reemplazar el consejo del mdico. Asegrese de hacerle al mdico cualquier pregunta que tenga. Document Released: 02/10/2009 Document Revised: 01/24/2018 Document Reviewed: 01/24/2018 Elsevier Patient Education  2020 Elsevier Inc.  Lymphadenopathy  Lymphadenopathy means that your lymph glands are swollen or larger than normal (enlarged). Lymph glands, also called lymph nodes, are collections of tissue that filter bacteria, viruses, and waste from your bloodstream. They are part of your body's disease-fighting system (immune system), which protects your body from germs. There may be different causes of lymphadenopathy, depending on where it is in your body. Some types go away on their own. Lymphadenopathy can occur anywhere that you have lymph glands, including these areas:  Neck (cervical  lymphadenopathy).  Chest (mediastinal lymphadenopathy).  Lungs (hilar lymphadenopathy).  Underarms (axillary lymphadenopathy).  Groin (inguinal lymphadenopathy). When your immune system responds to germs, infection-fighting cells and fluid build up in your lymph glands. This causes some swelling and enlargement. If the lymph glands do not go back to normal after you have an infection or disease, your health care provider may do tests. These tests help to monitor your condition and find the reason why the glands are still swollen and enlarged. Follow these instructions at home:  Get plenty of rest.  Take over-the-counter and prescription medicines only as told by your health care provider. Your health care provider may recommend over-the-counter medicines for pain.  If directed, apply heat to swollen lymph glands as often as told by your health care provider. Use the heat source that your health care provider recommends, such as a moist heat pack or a heating pad. ? Place a towel between your skin and the heat source. ? Leave the heat on for 20-30 minutes. ? Remove the heat if your skin turns bright red. This is especially important if you are unable to feel pain, heat, or cold. You may have a greater risk of getting burned.  Check your affected lymph glands every day for changes. Check other lymph gland areas as told by your health care provider. Check for changes such as: ? More swelling. ? Sudden increase in size. ? Redness or pain. ? Hardness.  Keep all follow-up visits as told by your health care provider. This is important. Contact a health care provider if you have:  Swelling  that gets worse or spreads to other areas.  Problems with breathing.  Lymph glands that: ? Are still swollen after 2 weeks. ? Have suddenly gotten bigger. ? Are red, painful, or hard.  A fever or chills.  Fatigue.  A sore throat.  Pain in your abdomen.  Weight loss.  Night sweats. Get help  right away if you have:  Fluid leaking from an enlarged lymph gland.  Severe pain.  Chest pain.  Shortness of breath. Summary  Lymphadenopathy means that your lymph glands are swollen or larger than normal (enlarged).  Lymph glands (also called lymph nodes) are collections of tissue that filter bacteria, viruses, and waste from the bloodstream. They are part of your body's disease-fighting system (immune system).  Lymphadenopathy can occur anywhere that you have lymph glands.  If your enlarged and swollen lymph glands do not go back to normal after you have an infection or disease, your health care provider may do tests to monitor your condition and find the reason why the glands are still swollen and enlarged.  Check your affected lymph glands every day for changes. Check other lymph gland areas as told by your health care provider. This information is not intended to replace advice given to you by your health care provider. Make sure you discuss any questions you have with your health care provider. Document Released: 08/23/2008 Document Revised: 10/27/2017 Document Reviewed: 09/29/2017 Elsevier Patient Education  2020 [employer].

## 2019-11-05 NOTE — Progress Notes (Signed)
Cassandra Coffey 38 y.o.   Chief Complaint  Patient presents with  . Mass     behind the LEFT ear and neck pain since this past weekend    HISTORY OF PRESENT ILLNESS: This is a 38 y.o. female complaining of tender lymph node behind left ear that started 3 days ago without any other significant symptoms.  No history of diabetes or immunocompromising condition.  HPI   Prior to Admission medications   Medication Sig Start Date End Date Taking? Authorizing Provider  rosuvastatin (CRESTOR) 10 MG tablet Take 1 tablet (10 mg total) by mouth daily. 09/19/19  Yes Maxum Cassarino, Eilleen Kempf, MD  Vitamin D, Ergocalciferol, (DRISDOL) 1.25 MG (50000 UT) CAPS capsule Take 1 capsule (50,000 Units total) by mouth every 7 (seven) days. 09/13/19  Yes Genia Del, MD  azithromycin (ZITHROMAX) 250 MG tablet Sig as indicated 11/04/19   Georgina Quint, MD  meclizine (ANTIVERT) 12.5 MG tablet Take 1 tablet (12.5 mg total) by mouth 3 (three) times daily as needed for dizziness. Patient not taking: Reported on 11/04/2019 10/03/19   Caprice Renshaw B, PA-C  ondansetron (ZOFRAN ODT) 4 MG disintegrating tablet Take 1 tablet (4 mg total) by mouth every 8 (eight) hours as needed for nausea or vomiting. Patient not taking: Reported on 11/04/2019 10/03/19   Renee Harder, PA-C    No Known Allergies  Patient Active Problem List   Diagnosis Date Noted  . Dyslipidemia 09/19/2019  . Iron deficiency anemia 09/19/2019  . Elevated cholesterol 09/27/2017    Past Medical History:  Diagnosis Date  . Anemia     History reviewed. No pertinent surgical history.  Social History   Socioeconomic History  . Marital status: Married    Spouse name: Not on file  . Number of children: Not on file  . Years of education: Not on file  . Highest education level: Not on file  Occupational History  . Not on file  Social Needs  . Financial resource strain: Not on file  . Food insecurity    Worry: Not on  file    Inability: Not on file  . Transportation needs    Medical: Not on file    Non-medical: Not on file  Tobacco Use  . Smoking status: Never Smoker  . Smokeless tobacco: Never Used  Substance and Sexual Activity  . Alcohol use: No    Alcohol/week: 0.0 standard drinks  . Drug use: No  . Sexual activity: Yes    Birth control/protection: Condom  Lifestyle  . Physical activity    Days per week: Not on file    Minutes per session: Not on file  . Stress: Not on file  Relationships  . Social Musician on phone: Not on file    Gets together: Not on file    Attends religious service: Not on file    Active member of club or organization: Not on file    Attends meetings of clubs or organizations: Not on file    Relationship status: Not on file  . Intimate partner violence    Fear of current or ex partner: Not on file    Emotionally abused: Not on file    Physically abused: Not on file    Forced sexual activity: Not on file  Other Topics Concern  . Not on file  Social History Narrative  . Not on file    Family History  Problem Relation Age of Onset  . Cancer Maternal Aunt  OVARIAN  . Breast cancer Maternal Aunt      Review of Systems  Constitutional: Negative.  Negative for chills and fever.  HENT: Negative.  Negative for congestion and sore throat.   Eyes: Negative.   Respiratory: Negative.  Negative for cough and shortness of breath.   Cardiovascular: Negative.  Negative for chest pain and palpitations.  Gastrointestinal: Negative.  Negative for abdominal pain, diarrhea, nausea and vomiting.  Genitourinary: Negative.  Negative for dysuria.  Musculoskeletal: Negative.  Negative for myalgias.  Skin: Negative.  Negative for rash.  Neurological: Negative.  Negative for dizziness and headaches.  All other systems reviewed and are negative.  Vitals:   11/04/19 1146  BP: 110/75  Pulse: 87  Resp: 16  Temp: 98.3 F (36.8 C)  SpO2: 100%      Physical Exam Vitals signs reviewed.  Constitutional:      Appearance: Normal appearance.  HENT:     Head: Normocephalic.     Right Ear: Tympanic membrane, ear canal and external ear normal.     Left Ear: Tympanic membrane, ear canal and external ear normal.     Mouth/Throat:     Mouth: Mucous membranes are moist.     Pharynx: Oropharynx is clear.  Eyes:     Extraocular Movements: Extraocular movements intact.     Conjunctiva/sclera: Conjunctivae normal.     Pupils: Pupils are equal, round, and reactive to light.  Neck:     Musculoskeletal: Normal range of motion.  Cardiovascular:     Rate and Rhythm: Normal rate and regular rhythm.     Pulses: Normal pulses.     Heart sounds: Normal heart sounds.  Pulmonary:     Effort: Pulmonary effort is normal.     Breath sounds: Normal breath sounds.  Musculoskeletal: Normal range of motion.  Lymphadenopathy:     Head:     Right side of head: No submental adenopathy.     Left side of head: Posterior auricular adenopathy present. No submandibular adenopathy.     Cervical: Cervical adenopathy present.     Left cervical: Posterior cervical adenopathy present.     Upper Body:     Right upper body: No supraclavicular adenopathy.     Left upper body: No supraclavicular adenopathy.  Skin:    General: Skin is warm and dry.     Capillary Refill: Capillary refill takes less than 2 seconds.  Neurological:     General: No focal deficit present.     Mental Status: She is alert and oriented to person, place, and time.  Psychiatric:        Mood and Affect: Mood normal.        Behavior: Behavior normal.      ASSESSMENT & PLAN: [name] was seen today for mass.  Diagnoses and all orders for this visit:  Lymphadenopathy of head and neck region -     azithromycin (ZITHROMAX) 250 MG tablet; Sig as indicated    Patient Instructions       If you have lab work done today you will be contacted with your lab results within the next 2  weeks.  If you have not heard from [place] then please contact us. The fastest way to get your results is to register for My Chart.   IF you received an x-ray today, you will receive an invoice from [facility]. Please contact [facility] at [phone] with questions or concerns regarding your invoice.   IF you received labwork today, you will  receive an invoice from [facility]. Please contact [facility] at [phone] with questions or concerns regarding your invoice.   Our billing staff will not be able to assist you with questions regarding bills from these companies.  You will be contacted with the lab results as soon as they are available. The fastest way to get your results is to activate your My Chart account. Instructions are located on the last page of this paperwork. If you have not heard from us regarding the results in 2 weeks, please contact this office.     Linfadenopata Lymphadenopathy  El trmino linfadenopata significa que los ganglios linfticos estn inflamados o son ms grandes que lo normal (estn agrandados). Los ganglios linfticos, tambin llamados ndulos linfticos, son grupos de tejido que filtran bacterias, virus y sustancias de desecho del torrente sanguneo. Forman parte del sistema de defensa de enfermedades del cuerpo (sistema inmunitario) que protege al cuerpo contra los grmenes. La linfadenopata puede tener diferentes causas, dependiendo del lugar del cuerpo en donde se encuentra. Algunos tipos desaparecen por s solos. La linfadenopata puede producirse en cualquier lugar que tenga ganglios linfticos, incluidas las siguientes reas:  Cuello (linfadenopata cervical).  Pecho (linfadenopata mediastinal).  Pulmones (linfadenopata hiliar).  Axilas (linfadenopata axilar).  Ingle (linfadenopata inguinal). Cuando el sistema inmunitario responde a los microbios, se produce una acumulacin de lquido y clulas en los ganglios linfticos. Esto  produce hinchazn y agrandamiento. Si los ganglios linfticos no vuelven a la normalidad despus de haber tenido una infeccin o enfermedad, el mdico puede realizar Brunswick Corporationalgunas pruebas. Estas pruebas ayudarn a Scientist, physiologicalcontrolar la enfermedad y Warehouse managerdeterminar el motivo por el cual los ganglios an estn hinchados y Brentwoodagrandados. Siga estas indicaciones en su casa:  Descanse lo suficiente.  Tome los medicamentos de venta libre y los recetados solamente como se lo haya indicado el mdico. El mdico puede recomendarle medicamentos de venta libre para Chief Technology Officerel dolor.  Si se lo indican, aplique calor en la zona de los ganglios linfticos con la frecuencia que le haya indicado el mdico. Use la fuente de calor que el mdico le recomiende, como una compresa de calor hmedo o una almohadilla trmica. ? Coloque una FirstEnergy Corptoalla entre la piel y la fuente de Airline pilotcalor. ? Aplique el calor durante 20 a 30minutos. ? Retire la fuente de calor si la piel se pone de color rojo brillante. Esto es muy importante si no puede Financial risk analystsentir dolor, calor o fro. Puede correr un riesgo mayor de sufrir quemaduras.  Controle los ganglios linfticos afectados todos los das para Armed forces logistics/support/administrative officerdetectar cambios. Controle otras reas de ganglios linfticos como se lo haya indicado el mdico. Controle si presenta cambios como: ? Aumento de la hinchazn. ? Sbito aumento del tamao. ? Enrojecimiento o dolor. ? Dureza.  Concurra a todas las visitas de [address]seguimiento como se lo haya indicado el mdico. Esto es importante. Comunquese con un mdico si tiene:  Una Home Depothinchazn que empeora o se extiende a Administrator, sportsotras reas.  Problemas para respirar.  Los ganglios linfticos: ? Todava estn hinchados despus de 2 semanas. ? Han aumentado de tamao repentinamente. ? Estn rojos o duros, o Secretary/administratorle duelen.  Fiebre o escalofros.  Fatiga.  Dolor de Advertising copywritergarganta.  Dolor en el abdomen.  Prdida de peso.  Sudoracin nocturna. Pida ayuda inmediatamente si tiene:  Lquido que supura de un  ganglio linftico agrandado.  Dolor intenso.  Dolor en el pecho.  Falta de aire. Resumen  El trmino linfadenopata significa que los ganglios linfticos estn inflamados o son ms grandes que lo  normal (estn agrandados).  Los ganglios linfticos (tambin llamados ndulos linfticos) son grupos de tejido que filtran bacterias, virus y sustancias de desecho del torrente sanguneo. Sherron Monday parte del sistema de defensa del cuerpo (sistema inmunitario).  La linfadenopata puede producirse en cualquier lugar que tenga ganglios linfticos.  Si los ganglios linfticos agrandados e hinchados no vuelven a la normalidad despus de una infeccin o una enfermedad, el mdico puede hacerle pruebas para Chief Operating Officer su afeccin y Oceanographer motivo por el cual los ganglios an estn hinchados y Engineer, civil (consulting).  Controle los ganglios linfticos afectados todos los das para Armed forces logistics/support/administrative officer. Controle otras reas de ganglios linfticos como se lo haya indicado el mdico. Esta informacin no tiene Theme park manager el consejo del mdico. Asegrese de hacerle al mdico cualquier pregunta que tenga. Document Released: 02/10/2009 Document Revised: 01/24/2018 Document Reviewed: 01/24/2018 Elsevier Patient Education  2020 Elsevier Inc.  Lymphadenopathy  Lymphadenopathy means that your lymph glands are swollen or larger than normal (enlarged). Lymph glands, also called lymph nodes, are collections of tissue that filter bacteria, viruses, and waste from your bloodstream. They are part of your body's disease-fighting system (immune system), which protects your body from germs. There may be different causes of lymphadenopathy, depending on where it is in your body. Some types go away on their own. Lymphadenopathy can occur anywhere that you have lymph glands, including these areas:  Neck (cervical lymphadenopathy).  Chest (mediastinal lymphadenopathy).  Lungs (hilar lymphadenopathy).  Underarms (axillary  lymphadenopathy).  Groin (inguinal lymphadenopathy). When your immune system responds to germs, infection-fighting cells and fluid build up in your lymph glands. This causes some swelling and enlargement. If the lymph glands do not go back to normal after you have an infection or disease, your health care provider may do tests. These tests help to monitor your condition and find the reason why the glands are still swollen and enlarged. Follow these instructions at home:  Get plenty of rest.  Take over-the-counter and prescription medicines only as told by your health care provider. Your health care provider may recommend over-the-counter medicines for pain.  If directed, apply heat to swollen lymph glands as often as told by your health care provider. Use the heat source that your health care provider recommends, such as a moist heat pack or a heating pad. ? Place a towel between your skin and the heat source. ? Leave the heat on for 20-30 minutes. ? Remove the heat if your skin turns bright red. This is especially important if you are unable to feel pain, heat, or cold. You may have a greater risk of getting burned.  Check your affected lymph glands every day for changes. Check other lymph gland areas as told by your health care provider. Check for changes such as: ? More swelling. ? Sudden increase in size. ? Redness or pain. ? Hardness.  Keep all follow-up visits as told by your health care provider. This is important. Contact a health care provider if you have:  Swelling that gets worse or spreads to other areas.  Problems with breathing.  Lymph glands that: ? Are still swollen after 2 weeks. ? Have suddenly gotten bigger. ? Are red, painful, or hard.  A fever or chills.  Fatigue.  A sore throat.  Pain in your abdomen.  Weight loss.  Night sweats. Get help right away if you have:  Fluid leaking from an enlarged lymph gland.  Severe pain.  Chest pain.  Shortness  of breath. Summary  Lymphadenopathy means  that your lymph glands are swollen or larger than normal (enlarged).  Lymph glands (also called lymph nodes) are collections of tissue that filter bacteria, viruses, and waste from the bloodstream. They are part of your body's disease-fighting system (immune system).  Lymphadenopathy can occur anywhere that you have lymph glands.  If your enlarged and swollen lymph glands do not go back to normal after you have an infection or disease, your health care provider may do tests to monitor your condition and find the reason why the glands are still swollen and enlarged.  Check your affected lymph glands every day for changes. Check other lymph gland areas as told by your health care provider. This information is not intended to replace advice given to you by your health care provider. Make sure you discuss any questions you have with your health care provider. Document Released: 08/23/2008 Document Revised: 10/27/2017 Document Reviewed: 09/29/2017 Elsevier Patient Education  2020 Elsevier Inc.      Edwina Barth, MD [facility]

## 2019-11-08 ENCOUNTER — Telehealth: Payer: Self-pay | Admitting: Family Medicine

## 2019-11-08 ENCOUNTER — Ambulatory Visit (INDEPENDENT_AMBULATORY_CARE_PROVIDER_SITE_OTHER): Payer: BC Managed Care – PPO | Admitting: Registered Nurse

## 2019-11-08 ENCOUNTER — Encounter: Payer: Self-pay | Admitting: Registered Nurse

## 2019-11-08 ENCOUNTER — Other Ambulatory Visit: Payer: Self-pay

## 2019-11-08 VITALS — BP 119/82 | HR 115 | Temp 98.7°F | Resp 12 | Ht 62.0 in | Wt 161.0 lb

## 2019-11-08 DIAGNOSIS — L02811 Cutaneous abscess of head [any part, except face]: Secondary | ICD-10-CM | POA: Diagnosis not present

## 2019-11-08 LAB — POCT CBC
Granulocyte percent: 78 %G (ref 37–80)
HCT, POC: 34.6 % (ref 29–41)
Hemoglobin: 10.9 g/dL — AB (ref 11–14.6)
Lymph, poc: 2.7 (ref 0.6–3.4)
MCH, POC: 21.4 pg — AB (ref 27–31.2)
MCHC: 31.5 g/dL — AB (ref 31.8–35.4)
MCV: 67.9 fL — AB (ref 76–111)
MID (cbc): 0.9 (ref 0–0.9)
MPV: 7.7 fL (ref 0–99.8)
POC Granulocyte: 12.7 — AB (ref 2–6.9)
POC LYMPH PERCENT: 16.3 %L (ref 10–50)
POC MID %: 5.7 %M (ref 0–12)
Platelet Count, POC: 483 10*3/uL — AB (ref 142–424)
RBC: 5.09 M/uL (ref 4.04–5.48)
RDW, POC: 17.1 %
WBC: 16.3 10*3/uL — AB (ref 4.6–10.2)

## 2019-11-08 MED ORDER — DOXYCYCLINE HYCLATE 100 MG PO TABS: 100.0000 mg | ORAL_TABLET | Freq: Two times a day (BID) | ORAL | 0 refills | Status: DC

## 2019-11-08 NOTE — Telephone Encounter (Signed)
7:39 PM After hours nurse call - had doxycycline  prescribed  Today - not at pharmacy.  Note reviewed. Plan for Doxy rx for 100mg  BID 10 days  - will send Rx.

## 2019-11-08 NOTE — Progress Notes (Signed)
Acute Office Visit  Subjective:    Patient ID: Cassandra GirtKathiana Segovia, female    DOB: January 23, 1981, 38 y.o.   MRN: [id number]  Chief Complaint  Patient presents with  . Mass    Pt stated--left side upper neck--painful to touch, lump, redness, pressure --it make it hard to sleep. Denied pain.    HPI Patient is in today for abscess of head  Located posterior of L ear. Was seen by Dr. Alvy BimlerSagardia on Monday for this - given Z pack, told to return if worsening or failing to improve  Reports it has worsened. It is very painful to even move her head and neck.  She reports some dizziness. Denies other neurological symptoms at this time.   Denies systemic symptoms of infection, including fever, nvd, fatigue, chills.  Past Medical History:  Diagnosis Date  . Anemia     History reviewed. No pertinent surgical history.  Family History  Problem Relation Age of Onset  . Cancer Maternal Aunt        OVARIAN  . Breast cancer Maternal Aunt     Social History   Socioeconomic History  . Marital status: Married    Spouse name: Not on file  . Number of children: Not on file  . Years of education: Not on file  . Highest education level: Not on file  Occupational History  . Not on file  Tobacco Use  . Smoking status: Never Smoker  . Smokeless tobacco: Never Used  Substance and Sexual Activity  . Alcohol use: No    Alcohol/week: 0.0 standard drinks  . Drug use: No  . Sexual activity: Yes    Birth control/protection: Condom  Other Topics Concern  . Not on file  Social History Narrative  . Not on file   Social Determinants of Health   Financial Resource Strain:   . Difficulty of Paying Living Expenses: Not on file  Food Insecurity:   . Worried About Programme researcher, broadcasting/film/videounning Out of Food in the Last Year: Not on file  . Ran Out of Food in the Last Year: Not on file  Transportation Needs:   . Lack of Transportation (Medical): Not on file  . Lack of Transportation (Non-Medical): Not on file    Physical Activity:   . Days of Exercise per Week: Not on file  . Minutes of Exercise per Session: Not on file  Stress:   . Feeling of Stress : Not on file  Social Connections:   . Frequency of Communication with Friends and Family: Not on file  . Frequency of Social Gatherings with Friends and Family: Not on file  . Attends Religious Services: Not on file  . Active Member of Clubs or Organizations: Not on file  . Attends BankerClub or Organization Meetings: Not on file  . Marital Status: Not on file  Intimate Partner Violence:   . Fear of Current or Ex-Partner: Not on file  . Emotionally Abused: Not on file  . Physically Abused: Not on file  . Sexually Abused: Not on file    Outpatient Medications Prior to Visit  Medication Sig Dispense Refill  . azithromycin (ZITHROMAX) 250 MG tablet Sig as indicated 6 tablet 0  . rosuvastatin (CRESTOR) 10 MG tablet Take 1 tablet (10 mg total) by mouth daily. 90 tablet 3  . Vitamin D, Ergocalciferol, (DRISDOL) 1.25 MG (50000 UT) CAPS capsule Take 1 capsule (50,000 Units total) by mouth every 7 (seven) days. 12 capsule 0  . meclizine (ANTIVERT) 12.5 MG tablet Take  1 tablet (12.5 mg total) by mouth 3 (three) times daily as needed for dizziness. (Patient not taking: Reported on 11/04/2019) 30 tablet 0  . ondansetron (ZOFRAN ODT) 4 MG disintegrating tablet Take 1 tablet (4 mg total) by mouth every 8 (eight) hours as needed for nausea or vomiting. (Patient not taking: Reported on 11/04/2019) 20 tablet 0   No facility-administered medications prior to visit.    No Known Allergies  Review of Systems Per hpi     Objective:    Physical Exam Vitals and nursing note reviewed.  Constitutional:      General: She is not in acute distress.    Appearance: Normal appearance. She is ill-appearing. She is not toxic-appearing or diaphoretic.  HENT:     Head: Normocephalic and atraumatic.     Right Ear: Tympanic membrane, ear canal and external ear normal. There  is no impacted cerumen.     Left Ear: Tympanic membrane, ear canal and external ear normal. There is no impacted cerumen.     Nose: Nose normal. No congestion or rhinorrhea.     Mouth/Throat:     Mouth: Mucous membranes are moist.     Pharynx: Oropharynx is clear. No oropharyngeal exudate or posterior oropharyngeal erythema.  Eyes:     General: No scleral icterus.       Right eye: No discharge.        Left eye: No discharge.     Extraocular Movements: Extraocular movements intact.     Conjunctiva/sclera: Conjunctivae normal.     Pupils: Pupils are equal, round, and reactive to light.  Cardiovascular:     Rate and Rhythm: Normal rate and regular rhythm.  Musculoskeletal:     Cervical back: Rigidity (d/t pain with rom) and tenderness present.  Lymphadenopathy:     Cervical: Cervical adenopathy present.  Skin:    General: Skin is warm and dry.     Findings: Erythema and rash present.       Neurological:     General: No focal deficit present.     Mental Status: She is alert and oriented to person, place, and time. Mental status is at baseline.     Cranial Nerves: No cranial nerve deficit.     Motor: No weakness.  Psychiatric:        Mood and Affect: Mood normal.        Behavior: Behavior normal.        Thought Content: Thought content normal.        Judgment: Judgment normal.     BP 119/82 (BP Location: Right Arm, Patient Position: Sitting, Cuff Size: Normal)   Pulse (!) 115   Temp 98.7 F (37.1 C)   Resp 12   Ht 5\' 2"  (1.575 m)   Wt 161 lb (73 kg)   SpO2 99%   BMI 29.45 kg/m  Wt Readings from Last 3 Encounters:  11/08/19 161 lb (73 kg)  11/04/19 165 lb 12.8 oz (75.2 kg)  10/07/19 167 lb 6.4 oz (75.9 kg)    Health Maintenance Due  Topic Date Due  . HIV Screening  06/09/1997    There are no preventive care reminders to display for this patient.   Lab Results  Component Value Date   TSH 1.29 09/04/2019   Lab Results  Component Value Date   WBC 14.7 (H)  10/03/2019   HGB 9.6 (L) 10/03/2019   HCT 32.2 (L) 10/03/2019   MCV 73.3 (L) 10/03/2019   PLT 477 (H) 10/03/2019  Lab Results  Component Value Date   NA 136 10/03/2019   K 4.3 10/03/2019   CO2 20 (L) 10/03/2019   GLUCOSE 140 (H) 10/03/2019   BUN 12 10/03/2019   CREATININE 0.76 10/03/2019   BILITOT 0.3 10/03/2019   ALKPHOS 53 10/03/2019   AST 17 10/03/2019   ALT 18 10/03/2019   PROT 7.4 10/03/2019   ALBUMIN 3.9 10/03/2019   CALCIUM 9.1 10/03/2019   ANIONGAP 9 10/03/2019   Lab Results  Component Value Date   CHOL 205 (H) 09/04/2019   Lab Results  Component Value Date   HDL 38 (L) 09/04/2019   Lab Results  Component Value Date   LDLCALC 140 (H) 09/04/2019   Lab Results  Component Value Date   TRIG 138 09/04/2019   Lab Results  Component Value Date   CHOLHDL 5.4 (H) 09/04/2019   Lab Results  Component Value Date   HGBA1C 5.3 09/15/2017       Assessment & Plan:   Problem List Items Addressed This Visit    None    Visit Diagnoses    Abscess of head    -  Primary   Relevant Orders   POCT CBC       No orders of the defined types were placed in this encounter.  PLAN  POC CBC to address white count  Will stat refer to Gen surg - pt will benefit greatly from drainage.  Doxycycline 100mg  PO bid for 10 days. Advised her to take with food and water. Reviewed sxs of recurring infection and reasons to return to clinic or ER.  Patient encouraged to call clinic with any questions, comments, or concerns.   , NP

## 2019-11-08 NOTE — Patient Instructions (Signed)
You have an abscess behind your ear  Starting today, take doxycycline 100mg  by mouth twice daily.  I have sent a referral to general surgery to have this abscess drained. They will call you shortly.     Tiene un absceso detrs de la oreja  A partir de hoy, tome 100 mg de doxiciclina por va oral [employer] al [employer].  Envi una derivacin a [place] general para que le drenan este absceso. Te llamarn en breve.

## 2019-11-11 ENCOUNTER — Other Ambulatory Visit: Payer: Self-pay

## 2019-11-11 ENCOUNTER — Encounter: Payer: Self-pay | Admitting: Emergency Medicine

## 2019-11-11 ENCOUNTER — Ambulatory Visit (INDEPENDENT_AMBULATORY_CARE_PROVIDER_SITE_OTHER): Payer: BC Managed Care – PPO | Admitting: Emergency Medicine

## 2019-11-11 VITALS — BP 122/80 | HR 91 | Temp 98.0°F | Ht 62.0 in | Wt 162.6 lb

## 2019-11-11 DIAGNOSIS — R590 Localized enlarged lymph nodes: Secondary | ICD-10-CM

## 2019-11-11 DIAGNOSIS — L02811 Cutaneous abscess of head [any part, except face]: Secondary | ICD-10-CM

## 2019-11-11 NOTE — Progress Notes (Signed)
Cassandra Coffey 38 y.o.   Chief Complaint  Patient presents with  . abcess on head    HISTORY OF PRESENT ILLNESS: This is a 38 y.o. female here for follow-up of left postauricular abscess.  Seen by me on 11/04/2019 and started on azithromycin with no response.  Returned on 11/08/2019 and started on doxycycline 100 mg twice a day which she started 3 days ago on Saturday.  Abscess was suspected.  States she is much better today.  HPI   Prior to Admission medications   Medication Sig Start Date End Date Taking? Authorizing Provider  doxycycline (VIBRA-TABS) 100 MG tablet Take 1 tablet (100 mg total) by mouth 2 (two) times daily. 11/08/19  Yes Wendie Agreste, MD  Vitamin D, Ergocalciferol, (DRISDOL) 1.25 MG (50000 UT) CAPS capsule Take 1 capsule (50,000 Units total) by mouth every 7 (seven) days. 09/13/19  Yes Princess Bruins, MD  azithromycin (ZITHROMAX) 250 MG tablet Sig as indicated Patient not taking: Reported on 11/11/2019 11/04/19   Horald Pollen, MD  meclizine (ANTIVERT) 12.5 MG tablet Take 1 tablet (12.5 mg total) by mouth 3 (three) times daily as needed for dizziness. Patient not taking: Reported on 11/04/2019 10/03/19   Lanna Poche B, PA-C  ondansetron (ZOFRAN ODT) 4 MG disintegrating tablet Take 1 tablet (4 mg total) by mouth every 8 (eight) hours as needed for nausea or vomiting. Patient not taking: Reported on 11/04/2019 10/03/19   Lanna Poche B, PA-C  rosuvastatin (CRESTOR) 10 MG tablet Take 1 tablet (10 mg total) by mouth daily. Patient not taking: Reported on 11/11/2019 09/19/19   Horald Pollen, MD    No Known Allergies  Patient Active Problem List   Diagnosis Date Noted  . Dyslipidemia 09/19/2019  . Iron deficiency anemia 09/19/2019  . Elevated cholesterol 09/27/2017    Past Medical History:  Diagnosis Date  . Anemia     History reviewed. No pertinent surgical history.  Social History   Socioeconomic History  . Marital  status: Married    Spouse name: Not on file  . Number of children: Not on file  . Years of education: Not on file  . Highest education level: Not on file  Occupational History  . Not on file  Tobacco Use  . Smoking status: Never Smoker  . Smokeless tobacco: Never Used  Substance and Sexual Activity  . Alcohol use: No    Alcohol/week: 0.0 standard drinks  . Drug use: No  . Sexual activity: Yes    Birth control/protection: Condom  Other Topics Concern  . Not on file  Social History Narrative  . Not on file   Social Determinants of Health   Financial Resource Strain:   . Difficulty of Paying Living Expenses: Not on file  Food Insecurity:   . Worried About Charity fundraiser in the Last Year: Not on file  . Ran Out of Food in the Last Year: Not on file  Transportation Needs:   . Lack of Transportation (Medical): Not on file  . Lack of Transportation (Non-Medical): Not on file  Physical Activity:   . Days of Exercise per Week: Not on file  . Minutes of Exercise per Session: Not on file  Stress:   . Feeling of Stress : Not on file  Social Connections:   . Frequency of Communication with Friends and Family: Not on file  . Frequency of Social Gatherings with Friends and Family: Not on file  . Attends Religious Services: Not on file  .  Active Member of Clubs or Organizations: Not on file  . Attends Banker Meetings: Not on file  . Marital Status: Not on file  Intimate Partner Violence:   . Fear of Current or Ex-Partner: Not on file  . Emotionally Abused: Not on file  . Physically Abused: Not on file  . Sexually Abused: Not on file    Family History  Problem Relation Age of Onset  . Cancer Maternal Aunt        OVARIAN  . Breast cancer Maternal Aunt      Review of Systems  Constitutional: Negative.  Negative for chills and fever.  HENT: Negative for congestion and sore throat.   Respiratory: Negative.  Negative for cough and shortness of breath.     Cardiovascular: Negative for chest pain and palpitations.  Gastrointestinal: Negative for abdominal pain, diarrhea, nausea and vomiting.  Skin: Negative.  Negative for rash.  Neurological: Negative for dizziness and headaches.  All other systems reviewed and are negative.  Today's Vitals   11/11/19 1533  BP: 122/80  Pulse: 91  Temp: 98 F (36.7 C)  TempSrc: Temporal  SpO2: 98%  Weight: 162 lb 9.6 oz (73.8 kg)  Height: 5\' 2"  (1.575 m)   Body mass index is 29.74 kg/m.   Physical Exam Vitals reviewed.  Constitutional:      Appearance: Normal appearance.  HENT:     Head: Normocephalic.  Eyes:     Extraocular Movements: Extraocular movements intact.     Pupils: Pupils are equal, round, and reactive to light.  Cardiovascular:     Rate and Rhythm: Normal rate.  Pulmonary:     Effort: Pulmonary effort is normal.  Musculoskeletal:        General: Normal range of motion.     Cervical back: Normal range of motion.  Lymphadenopathy:     Head:     Left side of head: Posterior auricular adenopathy present.     Cervical: No cervical adenopathy.  Skin:    General: Skin is warm.  Neurological:     General: No focal deficit present.     Mental Status: She is alert and oriented to person, place, and time.  Psychiatric:        Mood and Affect: Mood normal.        Behavior: Behavior normal.      ASSESSMENT & PLAN: Cassandra Coffey was seen today for abcess on head.  Diagnoses and all orders for this visit:  Abscess of head  Lymphadenopathy of head and neck region  Continue and finish doxycycline. Follow-up in 3 days with me.   Patient Instructions       If you have lab work done today you will be contacted with your lab results within the next 2 weeks.  If you have not heard from Vivi Martens then please contact us. The fastest way to get your results is to register for My Chart.   IF you received an x-ray today, you will receive an invoice from [facility]. Please  contact [facility] at [phone] with questions or concerns regarding your invoice.   IF you received labwork today, you will receive an invoice from [facility]. Please contact [facility] at [phone] with questions or concerns regarding your invoice.   Our billing staff will not be able to assist you with questions regarding bills from these companies.  You will be contacted with the lab results as soon as they are available. The fastest way to get your results is to  activate your My Chart account. Instructions are located on the last page of this paperwork. If you have not heard from us regarding the results in 2 weeks, please contact this office.      Absceso en la piel Skin Abscess  Un absceso en la piel es una zona infectada de la piel que contiene pus y Conservator, museum/galleryotros materiales. Un absceso puede presentarse en cualquier parte del cuerpo. Algunos abscesos se abren (rompen) por s solos. La mayora sigue empeorando si no se Research scientist (life sciences)le da tratamiento. La infeccin puede diseminarse hacia otras partes del cuerpo y Environmental health practitionerllegar a la sangre, lo que puede hacer que usted se sienta mal. Los abscesos en la piel son causados por grmenes que ingresan en la piel a travs de un corte o un rasguo. Tambin pueden ser causados por una obstruccin en las glndulas sebceas y sudorparas o una infeccin en los folculos capilares. Por lo general, el tratamiento de esta afeccin incluye lo siguiente:  Drenado del pus.  Toma de antibiticos.  Aplicacin de un pao hmedo y tibio sobre el absceso. Siga estas indicaciones en su casa: Medicamentos   Baxter Internationalome los medicamentos de venta libre y los recetados solamente como se lo haya indicado el mdico.  Si le recetaron un antibitico, tmelo como se lo haya indicado el mdico. No deje de tomar el antibitico aunque comience a sentirse mejor. Cuidado del absceso   Si usted tiene un absceso que no ha drenado, coloque un pao limpio, hmedo y tibio sobre el absceso  varias veces al da. Hgalo como se lo haya indicado el mdico.  Siga las indicaciones el mdico en lo que respecta al cuidado del absceso. Asegrese de hacer lo siguiente: ? [place] el absceso con una venda (vendaje). ? Cambie la venda o gasa como se lo haya indicado el mdico. ? Lvese las manos con agua y [place] antes de cambiar la venda o gasa. Use un desinfectante para manos si no dispone de [place] y [place].  Controle el ArvinMeritorabsceso todos los das para observar si hay signos de que la infeccin [place]. Est atento a los siguientes signos: ? Aumento del enrojecimiento, la hinchazn o Chief Technology Officerel dolor. ? Ms lquido [place] sangre. ? Calor. ? Mal olor o ms pus. Indicaciones generales  Para evitar la propagacin de la infeccin: ? No comparta artculos de cuidado personal, toallas ni jacuzzis con Nucor Corporationotras personas. ? Evite el contacto piel con piel con Nucor Corporationotras personas.  Concurra a todas las visitas de [address] Parkwayseguimiento como se lo haya indicado el mdico. Esto es importante. Comunquese con un mdico si:  Tiene ms enrojecimiento, hinchazn o dolor alrededor del absceso.  Aumenta la cantidad de lquido o sangre que sale del absceso.  Siente el absceso caliente cuando lo toca.  Tiene ms pus o percibe que un mal olor proviene del absceso.  Tiene fiebre.  Le duelen los msculos.  Tiene escalofros.  Se siente mal. Solicite ayuda de inmediato si:  Siente un dolor que es muy intenso.  Observa lneas rojas en la piel que se extienden desde el absceso. Resumen  Un absceso en la piel es una zona infectada de la piel que contiene pus y Conservator, museum/galleryotros materiales.  El absceso es causado por grmenes que ingresan en la piel a travs de un corte o un rasguo. Tambin pueden ser causados por una obstruccin en las glndulas sebceas y sudorparas o una infeccin en los folculos capilares.  Siga las indicaciones del mdico en cuanto al cuidado del absceso, la toma de [place] Solonmedicamentos,  la prevencin de infecciones y las  visitas de seguimiento. Esta informacin no tiene Theme park manager el consejo del mdico. Asegrese de hacerle al mdico cualquier pregunta que tenga. Document Released: 02/10/2009 Document Revised: 02/26/2018 Document Reviewed: 02/26/2018 Elsevier Patient Education  2020 Elsevier Inc.     Edwina Barth, MD [facility]

## 2019-11-11 NOTE — Patient Instructions (Addendum)
If you have lab work done today you will be contacted with your lab results within the next 2 weeks.  If you have not heard from [place] then please contact us. The fastest way to get your results is to register for My Chart.   IF you received an x-ray today, you will receive an invoice from [facility]. Please contact [facility] at [phone] with questions or concerns regarding your invoice.   IF you received labwork today, you will receive an invoice from [place]. Please contact [facility] at [phone] with questions or concerns regarding your invoice.   Our billing staff will not be able to assist you with questions regarding bills from these companies.  You will be contacted with the lab results as soon as they are available. The fastest way to get your results is to activate your My Chart account. Instructions are located on the last page of this paperwork. If you have not heard from [place] regarding the results in 2 weeks, please contact this office.      Absceso en la piel Skin Abscess  Un absceso en la piel es una zona infectada de la piel que contiene pus y Psychiatric nurse. Un absceso puede presentarse en cualquier parte del cuerpo. Algunos abscesos se abren (rompen) por s solos. La mayora sigue empeorando si no se Secretary/administrator. La infeccin puede diseminarse hacia otras partes del cuerpo y Sports administrator a la sangre, lo que puede hacer que usted se sienta mal. Los abscesos en la piel son causados por grmenes que ingresan en la piel a travs de un corte o un rasguo. Tambin pueden ser causados por una obstruccin en las glndulas sebceas y 2 o una infeccin en los folculos capilares. Por lo general, el tratamiento de esta afeccin incluye lo siguiente:  Drenado del pus.  Toma de antibiticos.  Aplicacin de un pao hmedo y tibio sobre el absceso. Siga estas indicaciones en su casa: Medicamentos   [employer] de venta libre y los  recetados solamente como se lo haya indicado el mdico.  Si le recetaron un antibitico, tmelo como se lo haya indicado el mdico. No deje de tomar el antibitico aunque comience a sentirse mejor. Cuidado del absceso   Si usted tiene un absceso que no ha drenado, coloque un pao limpio, hmedo y tibio sobre el absceso varias veces al da. Hgalo como se lo haya indicado el mdico.  Siga las indicaciones el mdico en lo que respecta al cuidado del absceso. Asegrese de hacer lo siguiente: ? [place] el absceso con una venda (vendaje). ? Cambie la venda o gasa como se lo haya indicado el mdico. ? Lvese las manos con agua y [place] antes de cambiar la venda o gasa. Use un desinfectante para manos si no dispone de [place] y [place].  Controle el [employer] para observar si hay signos de que la infeccin [place]. Est atento a los siguientes signos: ? Aumento del enrojecimiento, la hinchazn o Conservation officer, historic buildings. ? Ms lquido Delorise Shiner. ? Calor. ? Mal olor o ms pus. Indicaciones generales  Para evitar la propagacin de la infeccin: ? No comparta artculos de cuidado personal, toallas ni jacuzzis con [employer]. ? Evite el contacto piel con piel con [employer].  Concurra a todas las visitas de seguimiento como se lo haya indicado el mdico. Esto es importante. Comunquese con un mdico si:  Tiene ms enrojecimiento, hinchazn o dolor alrededor del absceso.  Aumenta la cantidad  de lquido o [employer] del absceso.  Siente el absceso caliente cuando lo toca.  Tiene ms pus o percibe que un mal olor proviene del absceso.  Tiene fiebre.  Le duelen los msculos.  Tiene escalofros.  Se siente mal. Solicite ayuda de inmediato si:  Siente un dolor que es muy intenso.  Observa lneas rojas en la piel que se extienden desde el absceso. Resumen  Un absceso en la piel es una zona infectada de la piel que contiene pus y Conservator, museum/gallery.  El absceso es causado por grmenes  que ingresan en la piel a travs de un corte o un rasguo. Tambin pueden ser causados por una obstruccin en las glndulas sebceas y sudorparas o una infeccin en los folculos capilares.  Siga las indicaciones del mdico en cuanto al cuidado del absceso, la toma de medicamentos, la prevencin de infecciones y las visitas de seguimiento. Esta informacin no tiene Theme park manager el consejo del mdico. Asegrese de hacerle al mdico cualquier pregunta que tenga. Document Released: 02/10/2009 Document Revised: 02/26/2018 Document Reviewed: 02/26/2018 Elsevier Patient Education  2020 [employer].

## 2019-11-14 ENCOUNTER — Other Ambulatory Visit: Payer: Self-pay

## 2019-11-14 ENCOUNTER — Encounter: Payer: Self-pay | Admitting: Emergency Medicine

## 2019-11-14 ENCOUNTER — Telehealth: Payer: Self-pay | Admitting: *Deleted

## 2019-11-14 ENCOUNTER — Ambulatory Visit (INDEPENDENT_AMBULATORY_CARE_PROVIDER_SITE_OTHER): Payer: BC Managed Care – PPO | Admitting: Emergency Medicine

## 2019-11-14 VITALS — BP 98/63 | HR 105 | Temp 98.6°F | Resp 16 | Ht 62.0 in | Wt 163.2 lb

## 2019-11-14 DIAGNOSIS — L02811 Cutaneous abscess of head [any part, except face]: Secondary | ICD-10-CM | POA: Diagnosis not present

## 2019-11-14 DIAGNOSIS — M940 Chondrocostal junction syndrome [Tietze]: Secondary | ICD-10-CM | POA: Diagnosis not present

## 2019-11-14 DIAGNOSIS — R590 Localized enlarged lymph nodes: Secondary | ICD-10-CM

## 2019-11-14 NOTE — Patient Instructions (Addendum)
If you have lab work done today you will be contacted with your lab results within the next 2 weeks.  If you have not heard from [place] then please contact us. The fastest way to get your results is to register for My Chart.   IF you received an x-ray today, you will receive an invoice from [facility]. Please contact [facility] at [phone] with questions or concerns regarding your invoice.   IF you received labwork today, you will receive an invoice from [place]. Please contact [facility] at [phone] with questions or concerns regarding your invoice.   Our billing staff will not be able to assist you with questions regarding bills from these companies.  You will be contacted with the lab results as soon as they are available. The fastest way to get your results is to activate your My Chart account. Instructions are located on the last page of this paperwork. If you have not heard from [place] regarding the results in 2 weeks, please contact this office.     Costocondritis Costochondritis La costocondritis es la hinchazn e irritacin (inflamacin) del tejido Statistician) que une las costillas con el esternn. Esto causa dolor en la parte frontal del pecho. Por lo general, el dolor tiene las siguientes caractersticas:  Comienza de forma gradual.  Se manifiesta en ms de [employer]. Generalmente, esta afeccin desaparece con el paso tiempo, sin tratamiento. Siga estas indicaciones en su casa:  No haga nada que intensifique el dolor.  Si se lo indican, aplique hielo sobre la zona del dolor: ? Field seismologist hielo en una bolsa plstica. ? Coloque una [employer] piel y la bolsa de hielo. ? Coloque el hielo durante 68minutos, 2 o 3veces por da.  Si se lo indican, aplique calor en la zona afectada con la frecuencia que le indique el mdico. Use la fuente de calor que el mdico le indique, por ejemplo, una compresa de calor hmedo o una almohadilla  trmica. ? Colquese una [employer] piel y la fuente de Freight forwarder. ? Aplique el calor durante 20 a 26minutos. ? Retire la fuente de calor si la piel se le pone de color rojo brillante. Esto es muy importante si no puede sentir el dolor, el calor o el fro. Puede correr un riesgo mayor de sufrir quemaduras.  Tome los medicamentos de venta libre y los recetados solamente como se lo haya indicado el mdico.  Retome sus actividades habituales como se lo haya indicado el mdico. Pregntele al mdico qu actividades son seguras para usted.  Concurra a todas las visitas de control como se lo haya indicado el mdico. Esto es importante. Comunquese con un mdico si:  Tiene escalofros o fiebre.  El dolor persiste o [place].  Tiene tos que no desaparece. Solicite ayuda de inmediato si:  Le falta el aire. Esta informacin no tiene Marine scientist el consejo del mdico. Asegrese de hacerle al mdico cualquier pregunta que tenga. Document Released: 12/17/2010 Document Revised: 02/16/2017 Document Reviewed: 03/09/2016 Elsevier Patient Education  2020 [place].  Costochondritis Costochondritis is swelling and irritation (inflammation) of the tissue (cartilage) that connects your ribs to your breastbone (sternum). This causes pain in the front of your chest. Usually, the pain:  Starts gradually.  Is in more than one rib. This condition usually goes away on its own over time. Follow these instructions at home:  Do not do anything that makes your pain worse.  If directed, put ice on the painful  area: ? Put ice in a plastic bag. ? Place a towel between your skin and the bag. ? Leave the ice on for 20 minutes, 2-3 times a day.  If directed, put heat on the affected area as often as told by your doctor. Use the heat source that your doctor tells you to use, such as a moist heat pack or a heating pad. ? Place a towel between your skin and the heat source. ? Leave the heat on for  20-30 minutes. ? Take off the heat if your skin turns bright red. This is very important if you cannot feel pain, heat, or cold. You may have a greater risk of getting burned.  Take over-the-counter and prescription medicines only as told by your doctor.  Return to your normal activities as told by your doctor. Ask your doctor what activities are safe for you.  Keep all follow-up visits as told by your doctor. This is important. Contact a doctor if:  You have chills or a fever.  Your pain does not go away or it gets worse.  You have a cough that does not go away. Get help right away if:  You are short of breath. This information is not intended to replace advice given to you by your health care provider. Make sure you discuss any questions you have with your health care provider. Document Released: 05/02/2008 Document Revised: 11/29/2017 Document Reviewed: 03/09/2016 Elsevier Patient Education  2020 [employer].

## 2019-11-14 NOTE — Progress Notes (Signed)
Cassandra Coffey 38 y.o.   Chief Complaint  Patient presents with  . abcess of head    3 day follow up  . Chest Pain    started yesterday    HISTORY OF PRESENT ILLNESS: This is a 38 y.o. female here for follow-up of lymphadenopathy/abscess of left post auricular area.  On doxycycline 100 mg twice a day.  Much improved. Also complaining of steady sharp pleuritic chest pain worse with movement and deep breathing for 2 days.  Non-smoker and no cardiac history.  No associated symptoms.  HPI   Prior to Admission medications   Medication Sig Start Date End Date Taking? Authorizing Provider  azithromycin (ZITHROMAX) 250 MG tablet Sig as indicated 11/04/19  Yes Jazlynne Milliner, Eilleen Kempf, MD  doxycycline (VIBRA-TABS) 100 MG tablet Take 1 tablet (100 mg total) by mouth 2 (two) times daily. 11/08/19  Yes Shade Flood, MD  Vitamin D, Ergocalciferol, (DRISDOL) 1.25 MG (50000 UT) CAPS capsule Take 1 capsule (50,000 Units total) by mouth every 7 (seven) days. 09/13/19  Yes Genia Del, MD  meclizine (ANTIVERT) 12.5 MG tablet Take 1 tablet (12.5 mg total) by mouth 3 (three) times daily as needed for dizziness. Patient not taking: Reported on 11/14/2019 10/03/19   Claudette Stapler C, PA-C  ondansetron (ZOFRAN ODT) 4 MG disintegrating tablet Take 1 tablet (4 mg total) by mouth every 8 (eight) hours as needed for nausea or vomiting. Patient not taking: Reported on 11/14/2019 10/03/19   Mannie Stabile, PA-C  rosuvastatin (CRESTOR) 10 MG tablet Take 1 tablet (10 mg total) by mouth daily. Patient not taking: Reported on 11/14/2019 09/19/19   Georgina Quint, MD    No Known Allergies  Patient Active Problem List   Diagnosis Date Noted  . Dyslipidemia 09/19/2019  . Iron deficiency anemia 09/19/2019  . Elevated cholesterol 09/27/2017    Past Medical History:  Diagnosis Date  . Anemia     No past surgical history on file.  Social History   Socioeconomic History  .  Marital status: Married    Spouse name: Not on file  . Number of children: Not on file  . Years of education: Not on file  . Highest education level: Not on file  Occupational History  . Not on file  Tobacco Use  . Smoking status: Never Smoker  . Smokeless tobacco: Never Used  Substance and Sexual Activity  . Alcohol use: No    Alcohol/week: 0.0 standard drinks  . Drug use: No  . Sexual activity: Yes    Birth control/protection: Condom  Other Topics Concern  . Not on file  Social History Narrative  . Not on file   Social Determinants of Health   Financial Resource Strain:   . Difficulty of Paying Living Expenses: Not on file  Food Insecurity:   . Worried About Programme researcher, broadcasting/film/video in the Last Year: Not on file  . Ran Out of Food in the Last Year: Not on file  Transportation Needs:   . Lack of Transportation (Medical): Not on file  . Lack of Transportation (Non-Medical): Not on file  Physical Activity:   . Days of Exercise per Week: Not on file  . Minutes of Exercise per Session: Not on file  Stress:   . Feeling of Stress : Not on file  Social Connections:   . Frequency of Communication with Friends and Family: Not on file  . Frequency of Social Gatherings with Friends and Family: Not on file  .  Attends Religious Services: Not on file  . Active Member of Clubs or Organizations: Not on file  . Attends Archivist Meetings: Not on file  . Marital Status: Not on file  Intimate Partner Violence:   . Fear of Current or Ex-Partner: Not on file  . Emotionally Abused: Not on file  . Physically Abused: Not on file  . Sexually Abused: Not on file    Family History  Problem Relation Age of Onset  . Cancer Maternal Aunt        OVARIAN  . Breast cancer Maternal Aunt      Review of Systems  Constitutional: Negative.  Negative for chills and fever.  HENT: Negative for congestion and sore throat.   Respiratory: Negative.  Negative for cough, hemoptysis, sputum  production and shortness of breath.   Cardiovascular: Positive for chest pain. Negative for palpitations, orthopnea, claudication, leg swelling and PND.  Gastrointestinal: Negative.  Negative for abdominal pain, diarrhea, nausea and vomiting.  Genitourinary: Negative.   Musculoskeletal: Negative.  Negative for myalgias.  Skin: Negative.  Negative for rash.  Neurological: Negative.  Negative for dizziness and headaches.  Endo/Heme/Allergies: Negative.   All other systems reviewed and are negative.   Today's Vitals   11/14/19 1036  BP: 98/63  Pulse: (!) 105  Resp: 16  Temp: 98.6 F (37 C)  TempSrc: Oral  SpO2: 99%  Weight: 163 lb 3.2 oz (74 kg)  Height: 5\' 2"  (1.575 m)   Body mass index is 29.85 kg/m.  Physical Exam Vitals reviewed.  Constitutional:      Appearance: She is well-developed.  HENT:     Head: Normocephalic.  Eyes:     Extraocular Movements: Extraocular movements intact.     Pupils: Pupils are equal, round, and reactive to light.  Cardiovascular:     Rate and Rhythm: Normal rate and regular rhythm.     Pulses: Normal pulses.     Heart sounds: Normal heart sounds.  Pulmonary:     Effort: Pulmonary effort is normal.     Breath sounds: Normal breath sounds.  Chest:     Chest wall: Tenderness present.  Abdominal:     Palpations: Abdomen is soft.     Tenderness: There is no abdominal tenderness.  Musculoskeletal:        General: Normal range of motion.     Cervical back: Normal range of motion and neck supple.  Lymphadenopathy:     Head:     Left side of head: Posterior auricular (Much improved with only mild residual tenderness and swelling) adenopathy present.  Skin:    General: Skin is warm and dry.     Capillary Refill: Capillary refill takes less than 2 seconds.  Neurological:     General: No focal deficit present.     Mental Status: She is alert and oriented to person, place, and time.  Psychiatric:        Mood and Affect: Mood normal.         Behavior: Behavior normal.      ASSESSMENT & PLAN: Cassandra Coffey was seen today for abcess of head and chest pain.  Diagnoses and all orders for this visit:  Lymphadenopathy of head and neck region Comments: Much improved  Abscess of head Comments: Much improved  Costochondritis   Continue and finish antibiotics.  Tylenol as needed for chest pain. Return to office as needed.   Patient Instructions       If you have lab work done today  you will be contacted with your lab results within the next 2 weeks.  If you have not heard from [place] then please contact us. The fastest way to get your results is to register for My Chart.   IF you received an x-ray today, you will receive an invoice from [facility]. Please contact [facility] at [phone] with questions or concerns regarding your invoice.   IF you received labwork today, you will receive an invoice from [facility]. Please contact [facility] at [phone] with questions or concerns regarding your invoice.   Our billing staff will not be able to assist you with questions regarding bills from these companies.  You will be contacted with the lab results as soon as they are available. The fastest way to get your results is to activate your My Chart account. Instructions are located on the last page of this paperwork. If you have not heard from [place] regarding the results in 2 weeks, please contact this office.     Costocondritis Costochondritis La costocondritis es la hinchazn e irritacin (inflamacin) del tejido Building surveyor) que une las costillas con el esternn. Esto causa dolor en la parte frontal del pecho. Por lo general, el dolor tiene las siguientes caractersticas:  Comienza de forma gradual.  Se manifiesta en ms de [employer] Generalmente, esta afeccin desaparece con el paso tiempo, sin tratamiento. Siga estas indicaciones en su casa:  No haga nada que intensifique el dolor.  Si se lo  indican, aplique hielo sobre la zona del dolor: ? Nature conservation officer hielo en una bolsa plstica. ? Coloque una [employer] piel y la bolsa de hielo. ? Coloque el hielo durante , 2 o 3veces por da.  Si se lo indican, aplique calor en la zona afectada con la frecuencia que le indique el mdico. Use la fuente de calor que el mdico le indique, por ejemplo, una compresa de calor hmedo o una almohadilla trmica. ? Colquese una [employer] piel y la fuente de Airline pilot. ? Aplique el calor durante 20 a . ? Retire la fuente de calor si la piel se le pone de color rojo brillante. Esto es muy importante si no puede sentir el dolor, el calor o el fro. Puede correr un riesgo mayor de sufrir quemaduras.  Tome los medicamentos de venta libre y los recetados solamente como se lo haya indicado el mdico.  Retome sus actividades habituales como se lo haya indicado el mdico. Pregntele al mdico qu actividades son seguras para usted.  Concurra a todas las visitas de control como se lo haya indicado el mdico. Esto es importante. Comunquese con un mdico si:  Tiene escalofros o fiebre.  El dolor persiste o Vian.  Tiene tos que no desaparece. Solicite ayuda de inmediato si:  Le falta el aire. Esta informacin no tiene Theme park manager el consejo del mdico. Asegrese de hacerle al mdico cualquier pregunta que tenga. Document Released: 12/17/2010 Document Revised: 02/16/2017 Document Reviewed: 03/09/2016 Elsevier Patient Education  2020 Elsevier Inc.  Costochondritis Costochondritis is swelling and irritation (inflammation) of the tissue (cartilage) that connects your ribs to your breastbone (sternum). This causes pain in the front of your chest. Usually, the pain:  Starts gradually.  Is in more than one rib. This condition usually goes away on its own over time. Follow these instructions at home:  Do not do anything that makes your pain worse.  If directed, put ice  on the painful area: ? Put ice in a plastic bag. ?  Place a towel between your skin and the bag. ? Leave the ice on for 20 minutes, 2-3 times a day.  If directed, put heat on the affected area as often as told by your doctor. Use the heat source that your doctor tells you to use, such as a moist heat pack or a heating pad. ? Place a towel between your skin and the heat source. ? Leave the heat on for 20-30 minutes. ? Take off the heat if your skin turns bright red. This is very important if you cannot feel pain, heat, or cold. You may have a greater risk of getting burned.  Take over-the-counter and prescription medicines only as told by your doctor.  Return to your normal activities as told by your doctor. Ask your doctor what activities are safe for you.  Keep all follow-up visits as told by your doctor. This is important. Contact a doctor if:  You have chills or a fever.  Your pain does not go away or it gets worse.  You have a cough that does not go away. Get help right away if:  You are short of breath. This information is not intended to replace advice given to you by your health care provider. Make sure you discuss any questions you have with your health care provider. Document Released: 05/02/2008 Document Revised: 11/29/2017 Document Reviewed: 03/09/2016 Elsevier Patient Education  2020 Elsevier Inc.      Edwina BarthMiguel Sava Proby, MD [facility]

## 2019-11-14 NOTE — Telephone Encounter (Signed)
Faxed health screening for to [employer]. Confirmation page 12:36 pm.

## 2019-12-12 ENCOUNTER — Other Ambulatory Visit: Payer: Self-pay

## 2019-12-13 ENCOUNTER — Ambulatory Visit (INDEPENDENT_AMBULATORY_CARE_PROVIDER_SITE_OTHER): Payer: BC Managed Care – PPO | Admitting: Obstetrics & Gynecology

## 2019-12-13 ENCOUNTER — Other Ambulatory Visit: Payer: Self-pay

## 2019-12-13 ENCOUNTER — Encounter: Payer: Self-pay | Admitting: Obstetrics & Gynecology

## 2019-12-13 VITALS — BP 128/82

## 2019-12-13 DIAGNOSIS — Z30430 Encounter for insertion of intrauterine contraceptive device: Secondary | ICD-10-CM | POA: Diagnosis not present

## 2019-12-13 NOTE — Progress Notes (Signed)
    Cassandra Coffey 02-17-1981 [id number]        39 y.o.  G2P2L2 Married  RP: Mirena IUD Insertion  HPI: LMP 12/03/2019 Normal.  Using withdrawal for contraception.  No pelvic pain.  No abnormal vaginal discharge.   OB History  Gravida Para Term Preterm AB Living  2 2       2   SAB TAB Ectopic Multiple Live Births               # Outcome Date GA Lbr Len/2nd Weight Sex Delivery Anes PTL Lv  2 Para           1 Para             Past medical history,surgical history, problem list, medications, allergies, family history and social history were all reviewed and documented in the EPIC chart.   Directed ROS with pertinent positives and negatives documented in the history of present illness/assessment and plan.  Exam:  Vitals:   12/13/19 1146  BP: 128/82   General appearance:  Normal                                                                    IUD procedure note       Patient presented to the office today for placement of Mirena IUD. The patient had previously been provided with literature information on this method of contraception. The risks benefits and pros and cons were discussed and all her questions were answered. She is fully aware that this form of contraception is 99% effective and is good for 5 years.  Pelvic exam: Vulva normal Vagina: No lesions or discharge Cervix: No lesions or discharge Uterus: AV position Adnexa: No masses or tenderness Rectal exam: Not done  The cervix was cleansed with Betadine solution. Hurricane spray on the cervix.  A single-tooth tenaculum was placed on the anterior cervical lip. The IUD was shown to the patient and inserted in a sterile fashion.  Hysterometry with the IUD as being inserted was 7 cm.  The IUD string was trimmed. The single-tooth tenaculum was removed. Patient was instructed to return back to the office in one month for follow up.        Assessment/Plan:  39 y.o. G2P2   1. Encounter for IUD insertion Decision  to use Mirena IUD both for heavy cycles and contraception.  Last menstrual period normal December 03, 2019.  On day 11 of her cycle.  Uses withdrawal currently for contraception.  Patient informed that this is not a good contraceptive method.  She still insists on having the Mirena inserted today.  Understands that an early conception is not likely but not completely ruled out.  Given that the Mirena IUD is likely to work as a contraceptive before implantation, decision to proceed with insertion today.  Easy insertion of Mirena IUD.  No complication.  Well-tolerated by patient.  Postprocedure precautions reviewed.  We will follow-up in 4 weeks for IUD check.  Counseling on above issues and coordination of care more than 50% for 15 minutes.   December 05, 2019 MD, 12:04 PM 12/13/2019

## 2019-12-13 NOTE — Patient Instructions (Addendum)
1. Encounter for IUD insertion Decision to use Mirena IUD both for heavy cycles and contraception.  Last menstrual period normal December 03, 2019.  On day 11 of her cycle.  Uses withdrawal currently for contraception.  Patient informed that this is not a good contraceptive method.  She still insists on having the Mirena inserted today.  Understands that an early conception is not likely but not completely ruled out.  Given that the Mirena IUD is likely to work as a contraceptive before implantation, decision to proceed with insertion today.  Easy insertion of Mirena IUD.  No complication.  Well-tolerated by patient.  Postprocedure precautions reviewed.  We will follow-up in 4 weeks for IUD check.  Verl Dicker un placer verle hoy!

## 2019-12-26 ENCOUNTER — Other Ambulatory Visit: Payer: Self-pay | Admitting: Emergency Medicine

## 2019-12-26 DIAGNOSIS — L02811 Cutaneous abscess of head [any part, except face]: Secondary | ICD-10-CM

## 2019-12-30 ENCOUNTER — Other Ambulatory Visit: Payer: Self-pay

## 2019-12-30 ENCOUNTER — Ambulatory Visit (INDEPENDENT_AMBULATORY_CARE_PROVIDER_SITE_OTHER): Payer: BC Managed Care – PPO | Admitting: Obstetrics & Gynecology

## 2019-12-30 ENCOUNTER — Encounter: Payer: Self-pay | Admitting: Obstetrics & Gynecology

## 2019-12-30 DIAGNOSIS — Z30432 Encounter for removal of intrauterine contraceptive device: Secondary | ICD-10-CM

## 2019-12-30 DIAGNOSIS — Z30011 Encounter for initial prescription of contraceptive pills: Secondary | ICD-10-CM

## 2019-12-30 DIAGNOSIS — T8332XA Displacement of intrauterine contraceptive device, initial encounter: Secondary | ICD-10-CM

## 2019-12-30 MED ORDER — NORETHIN ACE-ETH ESTRAD-FE 1-20 MG-MCG PO TABS: 1.0000 | ORAL_TABLET | Freq: Every day | ORAL | 4 refills | Status: DC

## 2019-12-30 NOTE — Patient Instructions (Signed)
1. IUD migration, initial encounter Second time that patient has an IUD migrating out with menses. Mirena IUD removed as the lower portion of the IUD was visible at the EO of the cervix/IUD migrated in cervix.  2. Encounter for IUD removal Mirena IUD removed easily, well tolerated, intact/complete, no Cx.  Interested in sterilization.  Will f/u with her husband to discuss Vasectomy vs Tubal Sterilization.  3. Encounter for initial prescription of contraceptive pills Start on low dose BCPs in the meantime.  Treated for Hypercholesterolemia.  No CI to low dose BCPs.  Usage reviewed and prescription sent to pharmacy.  Other orders - norethindrone-ethinyl estradiol (LOESTRIN FE) 1-20 MG-MCG tablet; Take 1 tablet by mouth daily.  Verl Dicker un placer verle hoy!

## 2019-12-30 NOTE — Progress Notes (Signed)
    Cassandra Coffey 1981-09-02 [id number]        39 y.o.  G2P2L2 Married  RP: Saw the IUD strings at the vulva during menses  HPI: Had a heavy period with severe cramps, felt a lot of pelvic pressure and saw and felt the IUD strings at the vulva.  Patient is very anxious about having an unwanted pregnancy.  Desires sterilization for her husband or herself.   OB History  Gravida Para Term Preterm AB Living  2 2       2   SAB TAB Ectopic Multiple Live Births               # Outcome Date GA Lbr Len/2nd Weight Sex Delivery Anes PTL Lv  2 Para           1 Para             Past medical history,surgical history, problem list, medications, allergies, family history and social history were all reviewed and documented in the EPIC chart.   Directed ROS with pertinent positives and negatives documented in the history of present illness/assessment and plan.  Exam:  There were no vitals filed for this visit. General appearance:  Normal  Abdomen: Normal  Gynecologic exam: Vulva normal.  Speculum:  Lower part of IUD visible at the EO of cervix.  IUD in cervix.  Removed by clamping the inferior aspect of the IUD and pulling.  IUD intact/complete, shown to patient and discarded.   Assessment/Plan:  39 y.o. G2P2   1. IUD migration, initial encounter Second time that patient has an IUD migrating out with menses. Mirena IUD removed as the lower portion of the IUD was visible at the EO of the cervix/IUD migrated in cervix.  2. Encounter for IUD removal Mirena IUD removed easily, well tolerated, intact/complete, no Cx.  Interested in sterilization.  Will f/u with her husband to discuss Vasectomy vs Tubal Sterilization.  3. Encounter for initial prescription of contraceptive pills Start on low dose BCPs in the meantime.  Treated for Hypercholesterolemia.  No CI to low dose BCPs.  Usage reviewed and prescription sent to pharmacy.  Other orders - norethindrone-ethinyl estradiol (LOESTRIN  FE) 1-20 MG-MCG tablet; Take 1 tablet by mouth daily.  30 MD, 4:34 PM 12/30/2019

## 2020-01-09 ENCOUNTER — Ambulatory Visit: Payer: BC Managed Care – PPO | Admitting: Obstetrics & Gynecology

## 2020-01-13 ENCOUNTER — Institutional Professional Consult (permissible substitution): Payer: BC Managed Care – PPO | Admitting: Obstetrics & Gynecology

## 2020-01-15 ENCOUNTER — Encounter: Payer: Self-pay | Admitting: Gynecology

## 2020-01-31 ENCOUNTER — Encounter: Payer: Self-pay | Admitting: Obstetrics & Gynecology

## 2020-01-31 ENCOUNTER — Other Ambulatory Visit: Payer: Self-pay

## 2020-01-31 ENCOUNTER — Ambulatory Visit (INDEPENDENT_AMBULATORY_CARE_PROVIDER_SITE_OTHER): Payer: BC Managed Care – PPO | Admitting: Obstetrics & Gynecology

## 2020-01-31 VITALS — BP 130/78

## 2020-01-31 DIAGNOSIS — Z302 Encounter for sterilization: Secondary | ICD-10-CM

## 2020-01-31 NOTE — Patient Instructions (Signed)
1. Encounter for sterilization As the counseling progressed on laparoscopic bilateral tubal sterilization versus vasectomy, it became apparent that neither were certain about their decision not to have more children, especially the husband who eventually stated clearly that he would like to have more children.  I therefore recommended them not to proceed with a sterilization procedure and rather continue on the birth control pills.  Patient has no contraindication to the birth control pills and is tolerating it well currently.  The importance of good compliance reinforced.  We will continue on the generic of Loestrin FE 1/20.  Follow-up for annual gynecologic exam October 2021.  Verl Dicker un placer verles hoy!

## 2020-01-31 NOTE — Progress Notes (Signed)
    Cassandra Coffey 29-Apr-1981 [id number]        39 y.o.  G2P2L2 Married.  Husband present.  RP: Counseling on sterilization  HPI: Patient seen on December 30, 2019 with Mirena IUD migration at the external os of the cervix.  It was the second time that she had an IUD migration.  Her IUD was therefore removed at that visit and she was started on low-dose birth control pills.  She is tolerating it well and has a good compliance.  Still, patient is worried to become pregnant, does not feel ready to conceive at this time.  Wanted her husband to come to the visit to discuss the possibility of vasectomy versus tubal ligation.   OB History  Gravida Para Term Preterm AB Living  2 2       2   SAB TAB Ectopic Multiple Live Births               # Outcome Date GA Lbr Len/2nd Weight Sex Delivery Anes PTL Lv  2 Para           1 Para             Past medical history,surgical history, problem list, medications, allergies, family history and social history were all reviewed and documented in the EPIC chart.   Directed ROS with pertinent positives and negatives documented in the history of present illness/assessment and plan.  Exam:  Vitals:   01/31/20 1041  BP: 130/78   General appearance:  Normal  Gynecologic exam deferred.   Assessment/Plan:  39 y.o. G2P2   1. Encounter for sterilization As the counseling progressed on laparoscopic bilateral tubal sterilization versus vasectomy, it became apparent that neither were certain about their decision not to have more children, especially the husband who eventually stated clearly that he would like to have more children.  I therefore recommended them not to proceed with a sterilization procedure and rather continue on the birth control pills.  Patient has no contraindication to the birth control pills and is tolerating it well currently.  The importance of good compliance reinforced.  We will continue on the generic of Loestrin FE 1/20.   Follow-up for annual gynecologic exam October 2021.  November 2021 MD, 11:17 AM 01/31/2020

## 2020-03-18 ENCOUNTER — Ambulatory Visit: Payer: BC Managed Care – PPO | Admitting: Emergency Medicine

## 2020-03-27 IMAGING — DX DG CHEST 1V PORT
1 series · 1 of 1 positions shown · non-contrast
Comparison: None.

CLINICAL DATA: Shortness of breath, dizzy and vomiting

EXAM:
PORTABLE CHEST 1 VIEW

[chest ap]
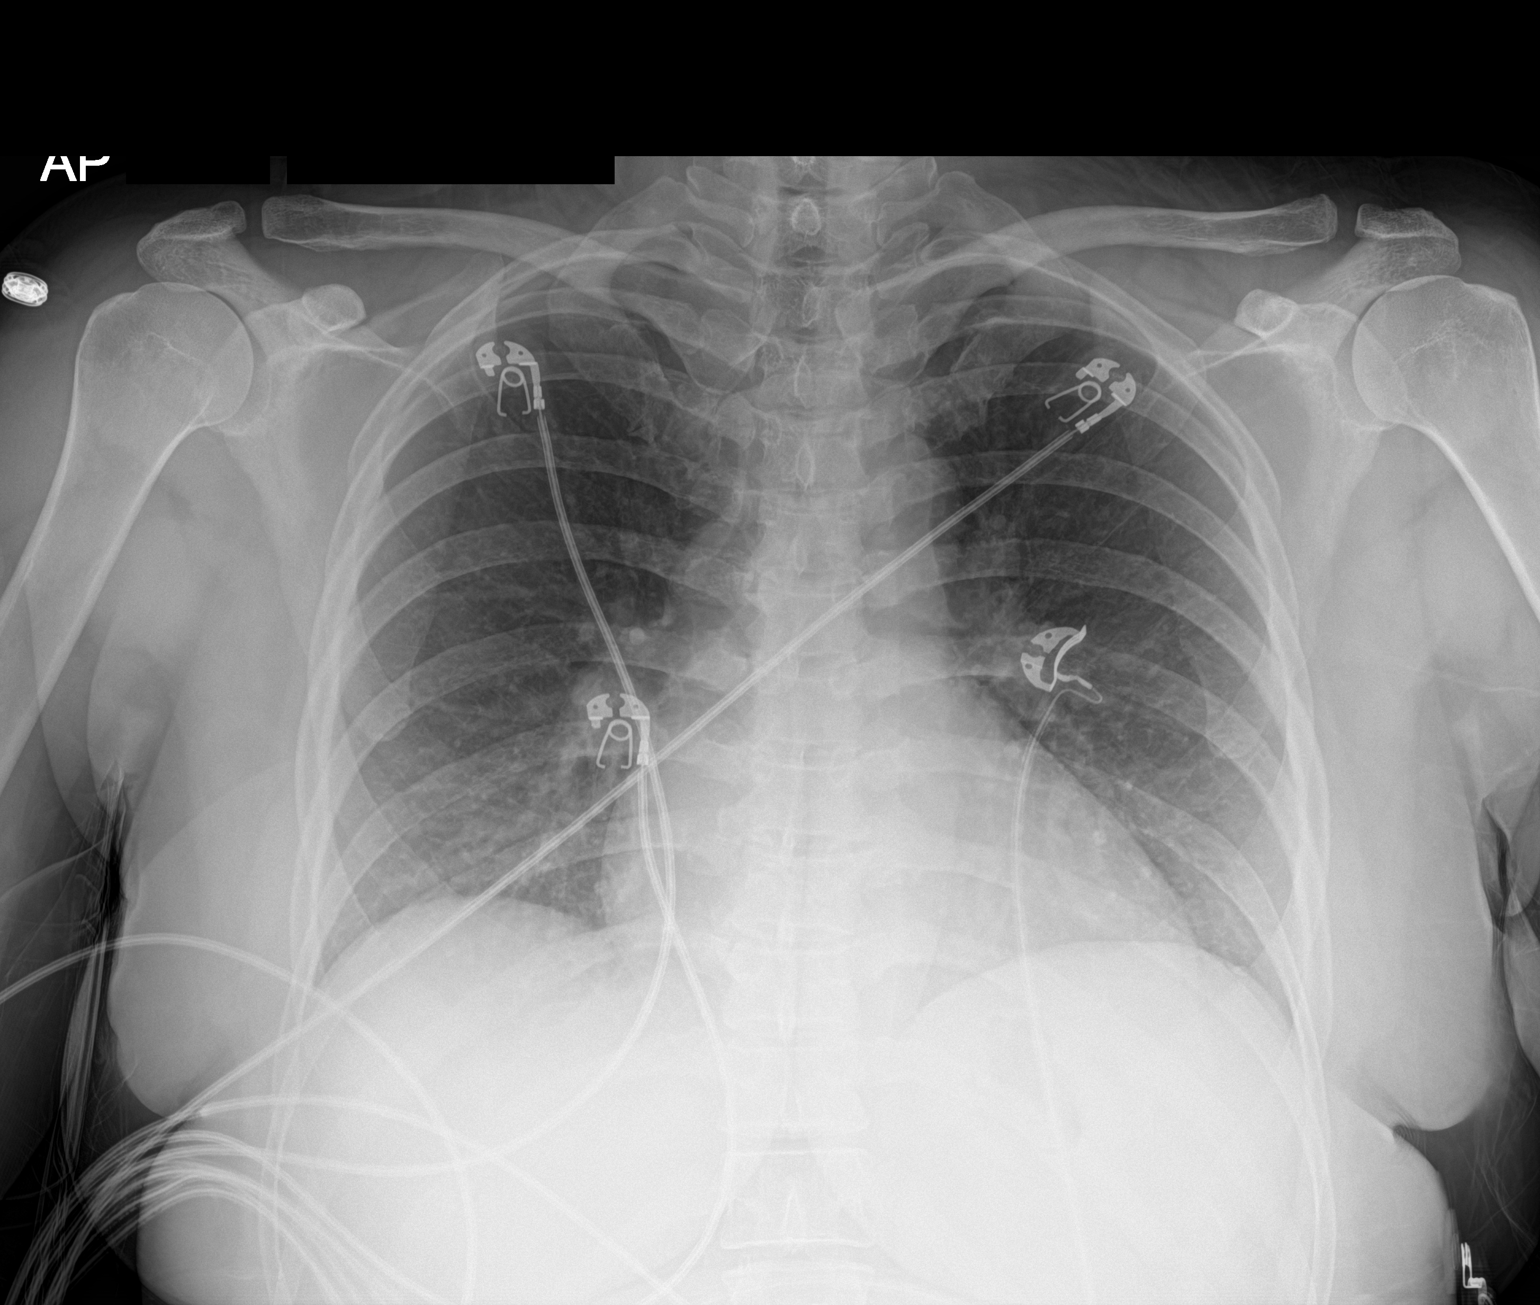

[1 of 1 positions shown; findings below may reference images not displayed]

FINDINGS: The heart size and mediastinal contours are within normal limits.
Both lungs are clear. The visualized skeletal structures are
unremarkable.
IMPRESSION: No active disease.

## 2020-06-29 ENCOUNTER — Ambulatory Visit: Payer: BC Managed Care – PPO | Admitting: Emergency Medicine

## 2020-06-30 ENCOUNTER — Other Ambulatory Visit: Payer: Self-pay

## 2020-06-30 ENCOUNTER — Encounter: Payer: Self-pay | Admitting: Emergency Medicine

## 2020-06-30 ENCOUNTER — Ambulatory Visit (INDEPENDENT_AMBULATORY_CARE_PROVIDER_SITE_OTHER): Payer: BC Managed Care – PPO | Admitting: Emergency Medicine

## 2020-06-30 VITALS — BP 108/72 | HR 90 | Temp 98.2°F | Resp 16 | Ht 62.0 in | Wt 168.0 lb

## 2020-06-30 DIAGNOSIS — R112 Nausea with vomiting, unspecified: Secondary | ICD-10-CM | POA: Diagnosis not present

## 2020-06-30 DIAGNOSIS — D509 Iron deficiency anemia, unspecified: Secondary | ICD-10-CM | POA: Diagnosis not present

## 2020-06-30 DIAGNOSIS — R42 Dizziness and giddiness: Secondary | ICD-10-CM

## 2020-06-30 LAB — POCT URINALYSIS DIP (MANUAL ENTRY)
Bilirubin, UA: NEGATIVE
Glucose, UA: NEGATIVE mg/dL
Ketones, POC UA: NEGATIVE mg/dL
Leukocytes, UA: NEGATIVE
Nitrite, UA: NEGATIVE
Protein Ur, POC: NEGATIVE mg/dL
Spec Grav, UA: 1.01 (ref 1.010–1.025)
Urobilinogen, UA: 0.2 E.U./dL
pH, UA: 6 (ref 5.0–8.0)

## 2020-06-30 LAB — POCT URINE PREGNANCY: Preg Test, Ur: NEGATIVE

## 2020-06-30 MED ORDER — MECLIZINE HCL 25 MG PO TABS: 25.0000 mg | ORAL_TABLET | Freq: Three times a day (TID) | ORAL | 1 refills | Status: DC | PRN

## 2020-06-30 NOTE — Progress Notes (Signed)
Cassandra Coffey 39 y.o.   Chief Complaint  Patient presents with  . Dizziness    for 6 days  . Emesis    HISTORY OF PRESENT ILLNESS: This is a 39 y.o. female complaining of dizziness that started 7 days ago.  Woke up feeling very dizzy and lasted all day.  Associated with nausea and vomiting as well as vertigo, "like helicopter blades swirling around me".  Not pregnant.  Presently at the end of her menstrual period.  Denies headaches.  Denies visual disturbances.  Denies flulike symptoms, fever or chills.  Not taking any medications at present time.  No new diet or lifestyle changes.  No significant past medical history.  Denies urinary symptoms or diarrhea. No other complaints or medical concerns today. Not vaccinated against Covid.  HPI   Prior to Admission medications   Medication Sig Start Date End Date Taking? Authorizing Provider  norethindrone-ethinyl estradiol (LOESTRIN FE) 1-20 MG-MCG tablet Take 1 tablet by mouth daily. Patient not taking: Reported on 06/30/2020 12/30/19   Cassandra Del, MD  Vitamin D, Ergocalciferol, (DRISDOL) 1.25 MG (50000 UT) CAPS capsule Take 1 capsule (50,000 Units total) by mouth every 7 (seven) days. Patient not taking: Reported on 06/30/2020 09/13/19   Cassandra Del, MD    No Known Allergies  Patient Active Problem List   Diagnosis Date Noted  . Dyslipidemia 09/19/2019  . Iron deficiency anemia 09/19/2019  . Elevated cholesterol 09/27/2017    Past Medical History:  Diagnosis Date  . Anemia     History reviewed. No pertinent surgical history.  Social History   Socioeconomic History  . Marital status: Married    Spouse name: Not on file  . Number of children: Not on file  . Years of education: Not on file  . Highest education level: Not on file  Occupational History  . Not on file  Tobacco Use  . Smoking status: Never Smoker  . Smokeless tobacco: Never Used  Vaping Use  . Vaping Use: Never used  Substance and  Sexual Activity  . Alcohol use: No    Alcohol/week: 0.0 standard drinks  . Drug use: No  . Sexual activity: Yes    Birth control/protection: Condom  Other Topics Concern  . Not on file  Social History Narrative  . Not on file   Social Determinants of Health   Financial Resource Strain:   . Difficulty of Paying Living Expenses:   Food Insecurity:   . Worried About Programme researcher, broadcasting/film/video in the Last Year:   . Barista in the Last Year:   Transportation Needs:   . Freight forwarder (Medical):   Marland Kitchen Lack of Transportation (Non-Medical):   Physical Activity:   . Days of Exercise per Week:   . Minutes of Exercise per Session:   Stress:   . Feeling of Stress :   Social Connections:   . Frequency of Communication with Friends and Family:   . Frequency of Social Gatherings with Friends and Family:   . Attends Religious Services:   . Active Member of Clubs or Organizations:   . Attends Banker Meetings:   Marland Kitchen Marital Status:   Intimate Partner Violence:   . Fear of Current or Ex-Partner:   . Emotionally Abused:   Marland Kitchen Physically Abused:   . Sexually Abused:     Family History  Problem Relation Age of Onset  . Cancer Maternal Aunt        OVARIAN  . Breast  cancer Maternal Aunt      Review of Systems  Constitutional: Negative.  Negative for chills and fever.  HENT: Negative.  Negative for congestion and sore throat.   Respiratory: Negative.  Negative for cough and shortness of breath.   Cardiovascular: Negative.  Negative for chest pain and palpitations.  Gastrointestinal: Positive for nausea and vomiting. Negative for abdominal pain, blood in stool, diarrhea and melena.  Genitourinary: Negative.  Negative for dysuria and hematuria.  Musculoskeletal: Negative.  Negative for back pain, myalgias and neck pain.  Skin: Negative.  Negative for rash.  Neurological: Positive for dizziness. Negative for sensory change, focal weakness, seizures, loss of consciousness  and headaches.  All other systems reviewed and are negative.   Vitals:   06/30/20 1327  BP: 108/72  Pulse: 90  Resp: 16  Temp: 98.2 F (36.8 C)  SpO2: 98%    Physical Exam Vitals reviewed.  Constitutional:      Appearance: Normal appearance.  HENT:     Head: Normocephalic.     Right Ear: Tympanic membrane, ear canal and external ear normal.     Left Ear: Tympanic membrane, ear canal and external ear normal.     Mouth/Throat:     Mouth: Mucous membranes are dry.     Pharynx: Oropharynx is clear.  Eyes:     Extraocular Movements: Extraocular movements intact.     Conjunctiva/sclera: Conjunctivae normal.     Pupils: Pupils are equal, round, and reactive to light.  Neck:     Vascular: No carotid bruit.  Cardiovascular:     Rate and Rhythm: Normal rate and regular rhythm.     Pulses: Normal pulses.     Heart sounds: Normal heart sounds.  Pulmonary:     Effort: Pulmonary effort is normal.     Breath sounds: Normal breath sounds.  Musculoskeletal:        General: Normal range of motion.     Cervical back: Normal range of motion and neck supple.     Right lower leg: No edema.     Left lower leg: No edema.  Lymphadenopathy:     Cervical: No cervical adenopathy.  Skin:    General: Skin is warm and dry.     Capillary Refill: Capillary refill takes less than 2 seconds.  Neurological:     General: No focal deficit present.     Mental Status: She is alert and oriented to person, place, and time.  Psychiatric:        Mood and Affect: Mood normal.        Behavior: Behavior normal.    A total of 30 minutes was spent with the patient, greater than 50% of which was in counseling/coordination of care regarding differential diagnosis of vertigo and dizziness, treatment including medication, need for diagnostic work-up, review of most recent blood work results, review of most recent office visit notes, diet and nutrition, prognosis and need for follow-up.   ASSESSMENT &  PLAN: Cassandra Coffey was seen today for dizziness and emesis.  Diagnoses and all orders for this visit:  Dizziness Comments: Recurrent Orders: -     CBC with Differential/Platelet -     Comprehensive metabolic panel -     Hemoglobin A1c -     POCT urinalysis dipstick -     meclizine (ANTIVERT) 25 MG tablet; Take 1 tablet (25 mg total) by mouth 3 (three) times daily as needed for dizziness. -     MR Brain W Wo Contrast; Future  Nausea and vomiting, intractability of vomiting not specified, unspecified vomiting type -     POCT urine pregnancy    Patient Instructions       If you have lab work done today you will be contacted with your lab results within the next 2 weeks.  If you have not heard from us then please contact us. The fastest way to get your results is to register for My Chart.   IF you received an x-ray today, you will receive an invoice [facility]. Please contact [facility] at [phone] with questions or concerns regarding your invoice.   IF you received labwork today, you will receive an invoice from [facility]. Please contact [facility] at [phone] with questions or concerns regarding your invoice.   Our billing staff will not be able to assist you with questions regarding bills from these companies.  You will be contacted with the lab results as soon as they are available. The fastest way to get your results is to activate your My Chart account. Instructions are located on the last page of this paperwork. If you have not heard from us regarding the results in 2 weeks, please contact this office.     Mareos Dizziness Los mareos son un problema muy frecuente. Causan sensacin de inestabilidad o de desvanecimiento. Puede sentir que se va a desmayar. Los Golden West Financialmareos pueden provocarle una lesin si se tropieza o se cae. La causa puede deberse a [employer] Energy Corporationmuchos problemas, tales como los siguientes:  Medicamentos.  No tener suficiente agua en el cuerpo  (deshidratacin).  Enfermedad. Siga estas indicaciones en su casa: Comida y bebida   Beba suficiente lquido para mantener el pis (orina) claro o de color amarillo plido. Esto evita la deshidratacin. Trate de beber ms lquidos transparentes, como agua.  No beba alcohol.  Limite la cantidad de cafena que bebe o come si el mdico se lo indica.  Limite la cantidad de sal (sodio) que bebe o come si el mdico se lo indica. Actividad   Evite los movimientos rpidos. ? Cuando se levante de una silla, sujtese hasta sentirse bien. ? Por la maana, sintese primero a un lado de la cama. Cuando se sienta bien, pngase lentamente de pie mientras se sostiene de algo. Haga esto hasta que se sienta seguro en cuanto al equilibrio.  Mueva las piernas con frecuencia si debe estar de pie en un lugar durante mucho tiempo. Mientras est de pie, contraiga y relaje los msculos de las piernas.  No conduzca vehculos ni opere maquinaria pesada si se siente mareado.  Evite agacharse si se siente mareado. En su casa, coloque los objetos en algn lugar que le resulte fcil alcanzarlos sin agacharse. Estilo de vida  No consuma ningn producto que contenga nicotina o tabaco, como cigarrillos y Administrator, Civil Servicecigarrillos electrnicos. Si necesita ayuda para dejar de fumar, consulte al mdico.  Intente bajar el nivel de estrs. Para hacerlo, puede usar mtodos como el yoga o la meditacin. Hable con el mdico si necesita ayuda. Instrucciones generales  Controle sus mareos para ver si hay cambios.  Tome los medicamentos de venta libre y los recetados solamente como se lo haya indicado el mdico. Hable con el mdico si cree que la causa de sus mareos es algn medicamento que est tomando.  Infrmele a un amigo o a un familiar si se siente mareado. Pdale a esta persona que llame al mdico si observa cambios en su comportamiento.  Concurra a todas las visitas de control como se lo haya  indicado el mdico. Esto es  importante. Comunquese con un mdico si:  Los [employer].  Los [employer] Financial o la sensacin de Production assistant, radio.  Siente malestar estomacal (nuseas).  Tiene problemas para escuchar.  Aparecen nuevos sntomas.  Siente inestabilidad al estar de pie.  Siente que la Biochemist, clinical vueltas. Solicite ayuda de inmediato si:  Vomita o tiene heces acuosas (diarrea), y no puede comer o beber nada.  Tiene dificultad para hacer lo siguiente: ? Hablar. ? Caminar. ? Tragar. ? Usar los brazos, las manos o las piernas.  Se siente constantemente dbil.  No piensa con claridad o tiene dificultad para armar oraciones. Es posible que un amigo o un familiar adviertan que esto ocurre.  Tiene los siguientes sntomas: ? Journalist, newspaper. ? Dolor en el vientre (abdomen). ? Falta de aire. ? Sudoracin.  Cambios en la visin.  Sangrado.  Dolor de cabeza muy intenso.  Dolor o rigidez en el cuello.  Grant Ruts. Estos sntomas pueden Customer service manager. No espere hasta que los sntomas desaparezcan. Solicite atencin mdica de inmediato. Comunquese con el servicio de emergencias de su localidad (911 en los [place]). No conduzca por sus propios medios [employer]. Resumen  Los mareos causan sensacin de inestabilidad o de desvanecimiento. Puede sentir que se va a desmayar.  Beba suficiente lquido para mantener el pis (orina) claro o de color amarillo plido. No beba alcohol.  Evite los movimientos rpidos si se siente mareado.  Controle sus mareos para ver si hay cambios. Esta informacin no tiene Theme park manager el consejo Coffey mdico. Asegrese de hacerle al mdico cualquier pregunta que tenga. Document Revised: 05/18/2017 Document Reviewed: 05/18/2017 Elsevier Patient Education  2020 Elsevier Inc.      Edwina Barth, MD [facility]

## 2020-06-30 NOTE — Patient Instructions (Addendum)
   If you have lab work done today you will be contacted with your lab results within the next 2 weeks.  If you have not heard from us then please contact us. The fastest way to get your results is to register for My Chart.   IF you received an x-ray today, you will receive an invoice from [facility]. Please contact [facility] at [phone] with questions or concerns regarding your invoice.   IF you received labwork today, you will receive an invoice from [facility]. Please contact [facility] at [phone] with questions or concerns regarding your invoice.   Our billing staff will not be able to assist you with questions regarding bills from these companies.  You will be contacted with the lab results as soon as they are available. The fastest way to get your results is to activate your My Chart account. Instructions are located on the last page of this paperwork. If you have not heard from us regarding the results in 2 weeks, please contact this office.      Mareos Dizziness Los mareos son un problema muy frecuente. Causan sensacin de inestabilidad o de desvanecimiento. Puede sentir que se va a desmayar. Los mareos pueden provocarle una lesin si se tropieza o se cae. La causa puede deberse a muchos problemas, tales como los siguientes:  Medicamentos.  No tener suficiente agua en el cuerpo (deshidratacin).  Enfermedad. Siga estas indicaciones en su casa: Comida y bebida   Beba suficiente lquido para mantener el pis (orina) claro o de color amarillo plido. Esto evita la deshidratacin. Trate de beber ms lquidos transparentes, como agua.  No beba alcohol.  Limite la cantidad de cafena que bebe o come si el mdico se lo indica.  Limite la cantidad de sal (sodio) que bebe o come si el mdico se lo indica. Actividad   Evite los movimientos rpidos. ? Cuando se levante de una silla, sujtese hasta sentirse bien. ? Por la maana, sintese primero a un  lado de la cama. Cuando se sienta bien, pngase lentamente de pie mientras se sostiene de algo. Haga esto hasta que se sienta seguro en cuanto al equilibrio.  Mueva las piernas con frecuencia si debe estar de pie en un lugar durante mucho tiempo. Mientras est de pie, contraiga y relaje los msculos de las piernas.  No conduzca vehculos ni opere maquinaria pesada si se siente mareado.  Evite agacharse si se siente mareado. En su casa, coloque los objetos en algn lugar que le resulte fcil alcanzarlos sin agacharse. Estilo de vida  No consuma ningn producto que contenga nicotina o tabaco, como cigarrillos y cigarrillos electrnicos. Si necesita ayuda para dejar de fumar, consulte al mdico.  Intente bajar el nivel de estrs. Para hacerlo, puede usar mtodos como el yoga o la meditacin. Hable con el mdico si necesita ayuda. Instrucciones generales  Controle sus mareos para ver si hay cambios.  Tome los medicamentos de venta libre y los recetados solamente como se lo haya indicado el mdico. Hable con el mdico si cree que la causa de sus mareos es algn medicamento que est tomando.  Infrmele a un amigo o a un familiar si se siente mareado. Pdale a esta persona que llame al mdico si observa cambios en su comportamiento.  Concurra a todas las visitas de control como se lo haya indicado el mdico. Esto es importante. Comunquese con un mdico si:  Los mareos persisten.  Los mareos o la sensacin de desvanecimiento empeoran.  Siente   malestar estomacal (nuseas).  Tiene problemas para escuchar.  Aparecen nuevos sntomas.  Siente inestabilidad al estar de pie.  Siente que la habitacin da vueltas. Solicite ayuda de inmediato si:  Vomita o tiene heces acuosas (diarrea), y no puede comer o beber nada.  Tiene dificultad para hacer lo siguiente: ? Hablar. ? Caminar. ? Tragar. ? Usar los brazos, las manos o las piernas.  Se siente constantemente dbil.  No piensa con  claridad o tiene dificultad para armar oraciones. Es posible que un amigo o un familiar adviertan que esto ocurre.  Tiene los siguientes sntomas: ? Dolor en el pecho. ? Dolor en el vientre (abdomen). ? Falta de aire. ? Sudoracin.  Cambios en la visin.  Sangrado.  Dolor de cabeza muy intenso.  Dolor o rigidez en el cuello.  Fiebre. Estos sntomas pueden indicar una emergencia. No espere hasta que los sntomas desaparezcan. Solicite atencin mdica de inmediato. Comunquese con el servicio de emergencias de su localidad (911 en [place]). No conduzca por sus propios medios hasta el hospital. Resumen  Los mareos causan sensacin de inestabilidad o de desvanecimiento. Puede sentir que se va a desmayar.  Beba suficiente lquido para mantener el pis (orina) claro o de color amarillo plido. No beba alcohol.  Evite los movimientos rpidos si se siente mareado.  Controle sus mareos para ver si hay cambios. Esta informacin no tiene como fin reemplazar el consejo del mdico. Asegrese de hacerle al mdico cualquier pregunta que tenga. Document Revised: 05/18/2017 Document Reviewed: 05/18/2017 Elsevier Patient Education  2020 Elsevier Inc.  

## 2020-07-01 LAB — COMPREHENSIVE METABOLIC PANEL
ALT: 15 IU/L (ref 0–32)
AST: 13 IU/L (ref 0–40)
Albumin/Globulin Ratio: 1.7 (ref 1.2–2.2)
Albumin: 4.7 g/dL (ref 3.8–4.8)
Alkaline Phosphatase: 70 IU/L (ref 48–121)
BUN/Creatinine Ratio: 15 (ref 9–23)
BUN: 10 mg/dL (ref 6–20)
Bilirubin Total: <0.2 mg/dL (ref 0.0–1.2)
CO2: 23 mmol/L (ref 20–29)
Calcium: 9.7 mg/dL (ref 8.7–10.2)
Chloride: 101 mmol/L (ref 96–106)
Creatinine, Ser: 0.68 mg/dL (ref 0.57–1.00)
GFR calc Af Amer: 128 mL/min/{1.73_m2} (ref >59)
GFR calc non Af Amer: 111 mL/min/{1.73_m2} (ref >59)
Globulin, Total: 2.8 g/dL (ref 1.5–4.5)
Glucose: 101 mg/dL — ABNORMAL HIGH (ref 65–99)
Potassium: 4.6 mmol/L (ref 3.5–5.2)
Sodium: 139 mmol/L (ref 134–144)
Total Protein: 7.5 g/dL (ref 6.0–8.5)

## 2020-07-01 LAB — HEMOGLOBIN A1C
Est. average glucose Bld gHb Est-mCnc: 111 mg/dL
Hgb A1c MFr Bld: 5.5 % (ref 4.8–5.6)

## 2020-07-01 LAB — CBC WITH DIFFERENTIAL/PLATELET
Basophils Absolute: 0.1 10*3/uL (ref 0.0–0.2)
Basos: 1 %
EOS (ABSOLUTE): 0.3 10*3/uL (ref 0.0–0.4)
Eos: 3 %
Hematocrit: 34.8 % (ref 34.0–46.6)
Hemoglobin: 10.4 g/dL — ABNORMAL LOW (ref 11.1–15.9)
Immature Grans (Abs): 0.1 10*3/uL (ref 0.0–0.1)
Immature Granulocytes: 1 %
Lymphocytes Absolute: 2.3 10*3/uL (ref 0.7–3.1)
Lymphs: 21 %
MCH: 22 pg — ABNORMAL LOW (ref 26.6–33.0)
MCHC: 29.9 g/dL — ABNORMAL LOW (ref 31.5–35.7)
MCV: 74 fL — ABNORMAL LOW (ref 79–97)
Monocytes Absolute: 0.9 10*3/uL (ref 0.1–0.9)
Monocytes: 8 %
Neutrophils Absolute: 7 10*3/uL (ref 1.4–7.0)
Neutrophils: 66 %
Platelets: 456 10*3/uL — ABNORMAL HIGH (ref 150–450)
RBC: 4.72 x10E6/uL (ref 3.77–5.28)
RDW: 15.7 % — ABNORMAL HIGH (ref 11.7–15.4)
WBC: 10.6 10*3/uL (ref 3.4–10.8)

## 2020-07-01 NOTE — Progress Notes (Signed)
Thanks

## 2020-07-01 NOTE — Addendum Note (Signed)
Addended by: Argentina Ponder on: 07/01/2020 11:28 AM   Modules accepted: Orders

## 2020-07-01 NOTE — Progress Notes (Signed)
Please add anemia panel if possible. Thanks.

## 2020-07-28 ENCOUNTER — Ambulatory Visit (INDEPENDENT_AMBULATORY_CARE_PROVIDER_SITE_OTHER): Payer: BC Managed Care – PPO | Admitting: Emergency Medicine

## 2020-07-28 ENCOUNTER — Other Ambulatory Visit: Payer: Self-pay

## 2020-07-28 ENCOUNTER — Encounter: Payer: Self-pay | Admitting: Emergency Medicine

## 2020-07-28 VITALS — BP 103/68 | HR 93 | Temp 98.3°F | Resp 16 | Ht 62.0 in | Wt 167.0 lb

## 2020-07-28 DIAGNOSIS — D649 Anemia, unspecified: Secondary | ICD-10-CM

## 2020-07-28 DIAGNOSIS — R42 Dizziness and giddiness: Secondary | ICD-10-CM | POA: Diagnosis not present

## 2020-07-28 NOTE — Patient Instructions (Addendum)
If you have lab work done today you will be contacted with your lab results within the next 2 weeks.  If you have not heard from [place] then please contact us. The fastest way to get your results is to register for My Chart.   IF you received an x-ray today, you will receive an invoice from [facility]. Please contact [facility] at [phone] with questions or concerns regarding your invoice.   IF you received labwork today, you will receive an invoice from [facility]. Please contact [facility] at [phone] with questions or concerns regarding your invoice.   Our billing staff will not be able to assist you with questions regarding bills from these companies.  You will be contacted with the lab results as soon as they are available. The fastest way to get your results is to activate your My Chart account. Instructions are located on the last page of this paperwork. If you have not heard from [place] regarding the results in 2 weeks, please contact this office.     Labyrinthitis  Labyrinthitis is an infection of the inner ear. Your inner ear is made up of tubes and canals (labyrinth). These are filled with fluid. There are nerve cells in your inner ear that send hearing and balance signals to your brain. When germs get inside the tubes and canals, they harm the nerve cells that send signals to the brain. This condition is often caused by a virus. You may have:  Dizziness.  Ringing in the ears.  Loss of hearing.  Loss of balance.  Stomach upset.  Throwing up. Follow these instructions at home: Medicines   Take over-the-counter and prescription medicines only as told by your doctor.  If you were given an antibiotic medicine, take it as told by your doctor. Do not stop taking the antibiotic even if you start to feel better. Activity  Rest as told by your doctor.  Limit the things you do as told. Ask your doctor what is safe for you to do.  Do not make any  sudden movements until you no longer feel dizzy.  Do physical therapy as told by your doctor. General instructions  Avoid loud noises and bright lights.  Do not drive until your doctor says that it is safe for you.  Drink enough fluid to keep your pee (urine) pale yellow.  Keep all follow-up visits as told by your doctor. This is important. Contact a doctor if:  Your symptoms do not get better.  You do not get better after two weeks.  You have a fever. Get help right away if:  You are very dizzy.  You keep throwing up.  You keep feeling sick to your stomach.  Your hearing gets much worse all of a sudden. Summary  Labyrinthitis is an infection of the inner ear.  This condition is often caused by a virus. Symptoms include dizziness, hearing loss, and ringing in the ears.  Treatment depends on the cause. Follow what your doctor tells you. This information is not intended to replace advice given to you by your health care provider. Make sure you discuss any questions you have with your health care provider. Document Revised: 09/03/2018 Document Reviewed: 11/25/2017 Elsevier Patient Education  2020 Elsevier Inc.  Anemia Anemia  La anemia es una afeccin en la cual no hay suficientes glbulos rojos o hemoglobina. La hemoglobina es la sustancia de los glbulos rojos que lleva el oxgeno. Cuando no hay suficientes glbulos rojos o hemoglobina (  est anmico), su cuerpo no puede recibir el oxgeno suficiente, y es posible que sus rganos no funcionen correctamente. Como [place], es posible que se sienta muy cansado o sufra otros problemas. Cules son las causas? Las causas ms frecuentes de anemia son:  Sharlyne Pacas. La anemia puede ser causada por un sangrado excesivo dentro o fuera del cuerpo, incluido el sangrado del intestino o del perodo en las mujeres.  Dficit nutricional.  Enfermedad heptica, tiroidea o renal (crnicas).  Trastornos de la mdula  sea.  Cncer y tratamientos para Management consultant.  VIH (virus de inmunodeficiencia [place]) y SIDA (sndrome de inmunodeficiencia adquirida).  Tratamientos para el VIH y [place].  Problemas en el bazo.  Enfermedades de [employer].  Infecciones, medicamentos y enfermedades autoinmunes que [employer] glbulos rojos. Cules son los signos o los sntomas? Los sntomas de esta afeccin incluyen los siguientes:  Debilidad leve.  Mareos.  Dolor de [place].  Sensacin de latidos cardacos irregulares o ms rpidos que lo normal (palpitaciones).  Falta de aire, especialmente con el ejercicio.  Palidez.  Sensibilidad al fro.  Dispepsia.  Nuseas.  Dificultad para dormir.  Dificultad para concentrarse. Los sntomas pueden ocurrir repentinamente o Audiological scientist. Si la anemia es leve, es posible que no tenga sntomas. Cmo se diagnostica? Esta afeccin se diagnostica en funcin de lo siguiente:  Anlisis de sangre.  Sus antecedentes mdicos.  Un examen fsico.  Biopsia de mdula sea. Adems, el mdico puede controlar si hay sangre en sus heces (materia fecal) y Education officer, environmental anlisis adicionales para Engineer, manufacturing la causa del sangrado. Tambin pueden hacerle otros estudios, por ejemplo:  Pruebas de diagnstico por imgenes, como una resonancia magntica (RM) o una exploracin por tomografa computarizada (TC).  Endoscopia.  Colonoscopia. Cmo se trata? El tratamiento de esta afeccin depende de la causa. Si contina perdiendo [employer], es posible que necesite recibir tratamiento en un hospital. El tratamiento puede incluir lo siguiente:  Tomar suplementos de hierro, vitamina B12 o cido flico.  Tomar un medicamento para las hormonas (eritropoyetina) que puede ayudar a Radio producer de glbulos rojos.  Recibir una transfusin de [place]. Esta ser necesaria si pierde [employer].  Realizar cambios en la dieta.  Someterse a [place] para Doctor, general practice. Siga estas indicaciones en su casa:  Tome los medicamentos de venta libre y los recetados solamente como se lo haya indicado el mdico.  Tome los suplementos solamente como se lo haya indicado el mdico.  Siga las instrucciones de la dieta que le hayan dado.  Concurra a todas las visitas de [address] se lo haya indicado el mdico. Esto es importante. Comunquese con un mdico si:  Tiene nuevos sangrados en cualquier parte del cuerpo. Solicite ayuda de inmediato si:  Se siente muy dbil.  Le falta el aire.  Siente dolor en la espalda, el abdomen o el pecho.  Se siente mareado o sufre un desmayo.  Tiene dificultad para concentrarse.  Las heces son alquitranadas, sanguinolentas o negras.  Vomita repetidamente o vomita sangre. Resumen  La anemia es una afeccin en la que no hay suficientes glbulos rojos o la cantidad suficiente de la sustancia de los glbulos rojos que transporta el oxgeno (hemoglobina).  Los sntomas pueden ocurrir repentinamente o Audiological scientist.  Si la anemia es leve, es posible que no tenga sntomas.  Esta afeccin se diagnostica mediante anlisis de sangre y un examen fsico, y en funcin de sus antecedentes mdicos. Pueden ser necesarios otros estudios.  [place]  de esta afeccin depende de la causa de la anemia. Esta informacin no tiene Theme park manager el consejo del mdico. Asegrese de hacerle al mdico cualquier pregunta que tenga. Document Revised: 03/06/2017 Document Reviewed: 03/06/2017 Elsevier Patient Education  2020 [employer].

## 2020-07-28 NOTE — Progress Notes (Signed)
Cassandra Coffey 39 y.o.   Chief Complaint  Patient presents with  . Dizziness    follow up one month-per patient it is better    HISTORY OF PRESENT ILLNESS: This is a 39 y.o. female here for follow-up of 06/30/2020 office visit when she was complaining of dizziness.  Was started on meclizine. Feeling much better.  Not having to use medication. MRI was requested but never scheduled.  No need now. Blood work revealed anemia which patient is aware of, most likely chronic secondary to menstrual losses. No other complaints or medical concerns. Still not vaccinated against Covid.  Strongly recommended to get the vaccine.  HPI   Prior to Admission medications   Medication Sig Start Date End Date Taking? Authorizing Provider  meclizine (ANTIVERT) 25 MG tablet Take 1 tablet (25 mg total) by mouth 3 (three) times daily as needed for dizziness. 06/30/20   Georgina Quint, MD  norethindrone-ethinyl estradiol (LOESTRIN FE) 1-20 MG-MCG tablet Take 1 tablet by mouth daily. Patient not taking: Reported on 06/30/2020 12/30/19   Genia Del, MD  Vitamin D, Ergocalciferol, (DRISDOL) 1.25 MG (50000 UT) CAPS capsule Take 1 capsule (50,000 Units total) by mouth every 7 (seven) days. Patient not taking: Reported on 06/30/2020 09/13/19   Genia Del, MD    No Known Allergies  Patient Active Problem List   Diagnosis Date Noted  . Dyslipidemia 09/19/2019  . Iron deficiency anemia 09/19/2019  . Elevated cholesterol 09/27/2017    Past Medical History:  Diagnosis Date  . Anemia     No past surgical history on file.  Social History   Socioeconomic History  . Marital status: Married    Spouse name: Not on file  . Number of children: Not on file  . Years of education: Not on file  . Highest education level: Not on file  Occupational History  . Not on file  Tobacco Use  . Smoking status: Never Smoker  . Smokeless tobacco: Never Used  Vaping Use  . Vaping Use: Never used    Substance and Sexual Activity  . Alcohol use: No    Alcohol/week: 0.0 standard drinks  . Drug use: No  . Sexual activity: Yes    Birth control/protection: Condom  Other Topics Concern  . Not on file  Social History Narrative  . Not on file   Social Determinants of Health   Financial Resource Strain:   . Difficulty of Paying Living Expenses: Not on file  Food Insecurity:   . Worried About Programme researcher, broadcasting/film/video in the Last Year: Not on file  . Ran Out of Food in the Last Year: Not on file  Transportation Needs:   . Lack of Transportation (Medical): Not on file  . Lack of Transportation (Non-Medical): Not on file  Physical Activity:   . Days of Exercise per Week: Not on file  . Minutes of Exercise per Session: Not on file  Stress:   . Feeling of Stress : Not on file  Social Connections:   . Frequency of Communication with Friends and Family: Not on file  . Frequency of Social Gatherings with Friends and Family: Not on file  . Attends Religious Services: Not on file  . Active Member of Clubs or Organizations: Not on file  . Attends Banker Meetings: Not on file  . Marital Status: Not on file  Intimate Partner Violence:   . Fear of Current or Ex-Partner: Not on file  . Emotionally Abused: Not on file  .  Physically Abused: Not on file  . Sexually Abused: Not on file    Family History  Problem Relation Age of Onset  . Cancer Maternal Aunt        OVARIAN  . Breast cancer Maternal Aunt      Review of Systems  Constitutional: Negative.  Negative for chills and fever.  HENT: Negative.  Negative for congestion, ear pain, hearing loss and sore throat.   Eyes: Negative.  Negative for blurred vision and double vision.  Respiratory: Negative.  Negative for cough and shortness of breath.   Cardiovascular: Negative.  Negative for chest pain and palpitations.  Gastrointestinal: Negative for abdominal pain, blood in stool, diarrhea, melena, nausea and vomiting.   Genitourinary: Negative.  Negative for dysuria and hematuria.  Musculoskeletal: Negative.  Negative for back pain, myalgias and neck pain.  Skin: Negative.  Negative for rash.  Neurological: Negative.  Negative for dizziness and headaches.  Endo/Heme/Allergies: Negative.   All other systems reviewed and are negative.   Today's Vitals   07/28/20 1015  BP: 103/68  Pulse: 93  Resp: 16  Temp: 98.3 F (36.8 C)  TempSrc: Temporal  SpO2: 99%  Weight: 167 lb (75.8 kg)  Height: 5\' 2"  (1.575 m)   Body mass index is 30.54 kg/m.  Physical Exam Vitals reviewed.  Constitutional:      Appearance: Normal appearance.  HENT:     Head: Normocephalic.  Eyes:     Extraocular Movements: Extraocular movements intact.     Pupils: Pupils are equal, round, and reactive to light.  Cardiovascular:     Rate and Rhythm: Normal rate and regular rhythm.     Heart sounds: Normal heart sounds.  Pulmonary:     Effort: Pulmonary effort is normal.     Breath sounds: Normal breath sounds.  Musculoskeletal:        General: Normal range of motion.     Cervical back: Normal range of motion.  Skin:    General: Skin is warm and dry.  Neurological:     General: No focal deficit present.     Mental Status: She is alert and oriented to person, place, and time.  Psychiatric:        Mood and Affect: Mood normal.        Behavior: Behavior normal.      ASSESSMENT & PLAN: Cassandra Coffey was seen today for dizziness.  Diagnoses and all orders for this visit:  Dizziness Comments: Much improved, most likely secondary to labyrinthitis  Chronic anemia -     Anemia panel    Patient Instructions       If you have lab work done today you will be contacted with your lab results within the next 2 weeks.  If you have not heard from Cassandra Coffey then please contact us. The fastest way to get your results is to register for My Chart.   IF you received an x-ray today, you will receive an invoice from [facility]  Rad[facility]. Please contact [facility] at [phone] with questions or concerns regarding your invoice.   IF you received labwork today, you will receive an invoice from [facility]. Please contact [facility] at [phone] with questions or concerns regarding your invoice.   Our billing staff will not be able to assist you with questions regarding bills from these companies.  You will be contacted with the lab results as soon as they are available. The fastest way to get your results is to activate your My Chart account. Instructions are  located on the last page of this paperwork. If you have not heard from [place] regarding the results in 2 weeks, please contact this office.     Labyrinthitis  Labyrinthitis is an infection of the inner ear. Your inner ear is made up of tubes and canals (labyrinth). These are filled with fluid. There are nerve cells in your inner ear that send hearing and balance signals to your brain. When germs get inside the tubes and canals, they harm the nerve cells that send signals to the brain. This condition is often caused by a virus. You may have:  Dizziness.  Ringing in the ears.  Loss of hearing.  Loss of balance.  Stomach upset.  Throwing up. Follow these instructions at home: Medicines   Take over-the-counter and prescription medicines only as told by your doctor.  If you were given an antibiotic medicine, take it as told by your doctor. Do not stop taking the antibiotic even if you start to feel better. Activity  Rest as told by your doctor.  Limit the things you do as told. Ask your doctor what is safe for you to do.  Do not make any sudden movements until you no longer feel dizzy.  Do physical therapy as told by your doctor. General instructions  Avoid loud noises and bright lights.  Do not drive until your doctor says that it is safe for you.  Drink enough fluid to keep your pee (urine) pale yellow.  Keep all follow-up visits as  told by your doctor. This is important. Contact a doctor if:  Your symptoms do not get better.  You do not get better after two weeks.  You have a fever. Get help right away if:  You are very dizzy.  You keep throwing up.  You keep feeling sick to your stomach.  Your hearing gets much worse all of a sudden. Summary  Labyrinthitis is an infection of the inner ear.  This condition is often caused by a virus. Symptoms include dizziness, hearing loss, and ringing in the ears.  Treatment depends on the cause. Follow what your doctor tells you. This information is not intended to replace advice given to you by your health care provider. Make sure you discuss any questions you have with your health care provider. Document Revised: 09/03/2018 Document Reviewed: 11/25/2017 Elsevier Patient Education  2020 Elsevier Inc.  Anemia Anemia  La anemia es una afeccin en la cual no hay suficientes glbulos rojos o hemoglobina. La hemoglobina es la sustancia de los glbulos rojos que lleva el oxgeno. Cuando no hay suficientes glbulos rojos o hemoglobina (est anmico), su cuerpo no puede recibir el oxgeno suficiente, y es posible que sus rganos no funcionen correctamente. Como [place], es posible que se sienta muy cansado o sufra otros problemas. Cules son las causas? Las causas ms frecuentes de anemia son:  Sharlyne Pacas. La anemia puede ser causada por un sangrado excesivo dentro o fuera del cuerpo, incluido el sangrado del intestino o del perodo en las mujeres.  Dficit nutricional.  Enfermedad heptica, tiroidea o renal (crnicas).  Trastornos de la mdula sea.  Cncer y tratamientos para Management consultant.  VIH (virus de inmunodeficiencia [place]) y SIDA (sndrome de inmunodeficiencia adquirida).  Tratamientos para el VIH y [place].  Problemas en el bazo.  Enfermedades de [employer].  Infecciones, medicamentos y enfermedades autoinmunes que [employer] glbulos  rojos. Cules son los signos o los sntomas? Los sntomas de esta afeccin [employer] siguientes:  Debilidad  leve.  Mareos.  Dolor de [place].  Sensacin de latidos cardacos irregulares o ms rpidos que lo normal (palpitaciones).  Falta de aire, especialmente con el ejercicio.  Palidez.  Sensibilidad al fro.  Dispepsia.  Nuseas.  Dificultad para dormir.  Dificultad para concentrarse. Los sntomas pueden ocurrir repentinamente o Audiological scientistmanifestarse lentamente. Si la anemia es leve, es posible que no tenga sntomas. Cmo se diagnostica? Esta afeccin se diagnostica en funcin de lo siguiente:  Anlisis de sangre.  Sus antecedentes mdicos.  Un examen fsico.  Biopsia de mdula sea. Adems, el mdico puede controlar si hay sangre en sus heces (materia fecal) y Education officer, environmentalrealizar anlisis adicionales para Engineer, manufacturingdetectar la causa del sangrado. Tambin pueden hacerle otros estudios, por ejemplo:  Pruebas de diagnstico por imgenes, como una resonancia magntica (RM) o una exploracin por tomografa computarizada (TC).  Endoscopia.  Colonoscopia. Cmo se trata? El tratamiento de esta afeccin depende de la causa. Si contina perdiendo [employer] Corporationmucha sangre, es posible que necesite recibir tratamiento en un hospital. El tratamiento puede incluir lo siguiente:  Tomar suplementos de hierro, vitamina B12 o cido flico.  Tomar un medicamento para las hormonas (eritropoyetina) que puede ayudar a Radio producerestimular el desarrollo de glbulos rojos.  Recibir una transfusin de Carefreesangre. Esta ser necesaria si pierde [employer] Corporationmucha sangre.  Realizar cambios en la dieta.  Someterse a [place] para Public house managerextirpar el bazo. Siga estas indicaciones en su casa:  Tome los medicamentos de venta libre y los recetados solamente como se lo haya indicado el mdico.  Tome los suplementos solamente como se lo haya indicado el mdico.  Siga las instrucciones de la dieta que le hayan dado.  Concurra a todas las visitas de  [address] como se lo haya indicado el mdico. Esto es importante. Comunquese con un mdico si:  Tiene nuevos sangrados en cualquier parte del cuerpo. Solicite ayuda de inmediato si:  Se siente muy dbil.  Le falta el aire.  Siente dolor en la espalda, el abdomen o el pecho.  Se siente mareado o sufre un desmayo.  Tiene dificultad para concentrarse.  Las heces son alquitranadas, sanguinolentas o negras.  Vomita repetidamente o vomita sangre. Resumen  La anemia es una afeccin en la que no hay suficientes glbulos rojos o la cantidad suficiente de la sustancia de los glbulos rojos que transporta el oxgeno (hemoglobina).  Los sntomas pueden ocurrir repentinamente o Audiological scientistmanifestarse lentamente.  Si la anemia es leve, es posible que no tenga sntomas.  Esta afeccin se diagnostica mediante anlisis de sangre y un examen fsico, y en funcin de sus antecedentes mdicos. Pueden ser necesarios otros estudios.  El tratamiento de esta afeccin depende de la causa de la anemia. Esta informacin no tiene Theme park managercomo fin reemplazar el consejo del mdico. Asegrese de hacerle al mdico cualquier pregunta que tenga. Document Revised: 03/06/2017 Document Reviewed: 03/06/2017 Elsevier Patient Education  2020 Elsevier Inc.      Edwina BarthMiguel Sira Adsit, MD [facility]

## 2020-07-29 LAB — ANEMIA PANEL
Ferritin: 9 ng/mL — ABNORMAL LOW (ref 15–150)
Folate, Hemolysate: 370 ng/mL
Folate, RBC: 1076 ng/mL (ref >498)
Hematocrit: 34.4 % (ref 34.0–46.6)
Iron Saturation: 5 % — CL (ref 15–55)
Iron: 22 ug/dL — ABNORMAL LOW (ref 27–159)
Retic Ct Pct: 1.4 % (ref 0.6–2.6)
Total Iron Binding Capacity: 450 ug/dL (ref 250–450)
UIBC: 428 ug/dL — ABNORMAL HIGH (ref 131–425)
Vitamin B-12: 354 pg/mL (ref 232–1245)

## 2020-09-04 ENCOUNTER — Ambulatory Visit (INDEPENDENT_AMBULATORY_CARE_PROVIDER_SITE_OTHER): Payer: BC Managed Care – PPO | Admitting: Obstetrics & Gynecology

## 2020-09-04 ENCOUNTER — Other Ambulatory Visit: Payer: Self-pay

## 2020-09-04 ENCOUNTER — Encounter: Payer: Self-pay | Admitting: Obstetrics & Gynecology

## 2020-09-04 VITALS — BP 118/80 | Ht 62.0 in | Wt 170.0 lb

## 2020-09-04 DIAGNOSIS — Z6831 Body mass index (BMI) 31.0-31.9, adult: Secondary | ICD-10-CM

## 2020-09-04 DIAGNOSIS — Z3002 Counseling and instruction in natural family planning to avoid pregnancy: Secondary | ICD-10-CM

## 2020-09-04 DIAGNOSIS — Z01419 Encounter for gynecological examination (general) (routine) without abnormal findings: Secondary | ICD-10-CM

## 2020-09-04 DIAGNOSIS — E6609 Other obesity due to excess calories: Secondary | ICD-10-CM

## 2020-09-04 NOTE — Progress Notes (Signed)
Cassandra Coffey June 03, 1981 [id number]   History:    39 y.o. G2P2L2 Married.  Daughter 48 yo.  Son 37 yo.  RP:  Established patient presenting for annual gyn exam   HPI: Stopped generic of LoEstrin Fe 1/20, using natural method for contraception, ok if conceives.  Menses normal every 25 days.  No BTB.  No pelvic pain.  Paragard IUD migration to the EO, removed on 12/30/2019.  Urine/BMs normal.  Breasts normal.  BMI 31.09.  Walking.  Fasting Health Labs with Fam MD.   Past medical history,surgical history, family history and social history were all reviewed and documented in the EPIC chart.  Gynecologic History Patient's last menstrual period was 08/05/2020 (lmp unknown).  Obstetric History OB History  Gravida Para Term Preterm AB Living  2 2       2   SAB TAB Ectopic Multiple Live Births               # Outcome Date GA Lbr Len/2nd Weight Sex Delivery Anes PTL Lv  2 Para           1 Para              ROS: A ROS was performed and pertinent positives and negatives are included in the history.  GENERAL: No fevers or chills. HEENT: No change in vision, no earache, sore throat or sinus congestion. NECK: No pain or stiffness. CARDIOVASCULAR: No chest pain or pressure. No palpitations. PULMONARY: No shortness of breath, cough or wheeze. GASTROINTESTINAL: No abdominal pain, nausea, vomiting or diarrhea, melena or bright red blood per rectum. GENITOURINARY: No urinary frequency, urgency, hesitancy or dysuria. MUSCULOSKELETAL: No joint or muscle pain, no back pain, no recent trauma. DERMATOLOGIC: No rash, no itching, no lesions. ENDOCRINE: No polyuria, polydipsia, no heat or cold intolerance. No recent change in weight. HEMATOLOGICAL: No anemia or easy bruising or bleeding. NEUROLOGIC: No headache, seizures, numbness, tingling or weakness. PSYCHIATRIC: No depression, no loss of interest in normal activity or change in sleep pattern.     Exam:   BP 118/80 (BP Location: Right Arm,  Patient Position: Sitting, Cuff Size: Normal)   Ht 5\' 2"  (1.575 m)   Wt 170 lb (77.1 kg)   LMP 08/05/2020 (LMP Unknown)   BMI 31.09 kg/m   Body mass index is 31.09 kg/m.  General appearance : Well developed well nourished female. No acute distress HEENT: Eyes: no retinal hemorrhage or exudates,  Neck supple, trachea midline, no carotid bruits, no thyroidmegaly Lungs: Clear to auscultation, no rhonchi or wheezes, or rib retractions  Heart: Regular rate and rhythm, no murmurs or gallops Breast:Examined in sitting and supine position were symmetrical in appearance, no palpable masses or tenderness,  no skin retraction, no nipple inversion, no nipple discharge, no skin discoloration, no axillary or supraclavicular lymphadenopathy Abdomen: no palpable masses or tenderness, no rebound or guarding Extremities: no edema or skin discoloration or tenderness  Pelvic: Vulva: Normal             Vagina: No gross lesions or discharge  Cervix: No gross lesions or discharge  Uterus  AV, normal size, shape and consistency, non-tender and mobile  Adnexa  Without masses or tenderness  Anus: Normal   Assessment/Plan:  39 y.o. female for annual exam   1. Well female exam with routine gynecological exam Normal gynecologic exam.  Pap test last year was negative, no indication to repeat a Pap test this year.  Breast exam normal.  Health labs with family  physician.  2. Encounter for counseling and instruction in natural family planning to avoid pregnancy Patient decided to stop birth control pills.  Prefers natural family planning.  Understands and agrees with the risk of conception.  Declines alternative methods of contraception.  3. Class 1 obesity due to excess calories without serious comorbidity with body mass index (BMI) of 31.0 to 31.9 in adult Recommend a lower calorie/carb diet.  Aerobic activities 5 times a week and light weightlifting every 2 days.  Other orders - VITAMIN D PO; Take 1 tablet  by mouth daily.  Genia Del MD, 10:40 AM 09/04/2020

## 2021-09-07 ENCOUNTER — Other Ambulatory Visit: Payer: Self-pay

## 2021-09-07 ENCOUNTER — Encounter: Payer: Self-pay | Admitting: Obstetrics & Gynecology

## 2021-09-07 ENCOUNTER — Ambulatory Visit (INDEPENDENT_AMBULATORY_CARE_PROVIDER_SITE_OTHER): Payer: BC Managed Care – PPO | Admitting: Obstetrics & Gynecology

## 2021-09-07 ENCOUNTER — Other Ambulatory Visit (HOSPITAL_COMMUNITY)
Admission: RE | Admit: 2021-09-07 | Discharge: 2021-09-07 | Disposition: A | Discharge status: Home / Self Care | Payer: BC Managed Care – PPO | Source: Ambulatory Visit | Attending: Obstetrics & Gynecology | Admitting: Obstetrics & Gynecology

## 2021-09-07 VITALS — BP 120/68 | HR 93 | Resp 16 | Ht 62.25 in | Wt 175.0 lb

## 2021-09-07 DIAGNOSIS — Z6831 Body mass index (BMI) 31.0-31.9, adult: Secondary | ICD-10-CM | POA: Diagnosis not present

## 2021-09-07 DIAGNOSIS — Z3002 Counseling and instruction in natural family planning to avoid pregnancy: Secondary | ICD-10-CM

## 2021-09-07 DIAGNOSIS — Z01419 Encounter for gynecological examination (general) (routine) without abnormal findings: Secondary | ICD-10-CM | POA: Insufficient documentation

## 2021-09-07 DIAGNOSIS — E6609 Other obesity due to excess calories: Secondary | ICD-10-CM

## 2021-09-07 NOTE — Progress Notes (Signed)
Cassandra Coffey 29-Dec-1980 [id number]   History:    40 y.o. G2P2L2 Married.  Daughter 18 yo.  Son 67 yo.   RP:  Established patient presenting for annual gyn exam    HPI: Using natural method for contraception, ok if conceives.  Menses normal every 25 days.  No BTB.  No pelvic pain.  Paragard IUD migration to the EO, removed on 12/30/2019.  Urine/BMs normal.  Breasts normal.  BMI 31.75.  Walking.  Fasting Health Labs with Fam MD.   Past medical history,surgical history, family history and social history were all reviewed and documented in the EPIC chart.  Gynecologic History Patient's last menstrual period was 08/16/2021 (exact date).  Obstetric History OB History  Gravida Para Term Preterm AB Living  2 2       2   SAB IAB Ectopic Multiple Live Births               # Outcome Date GA Lbr Len/2nd Weight Sex Delivery Anes PTL Lv  2 Para           1 Para              ROS: A ROS was performed and pertinent positives and negatives are included in the history.  GENERAL: No fevers or chills. HEENT: No change in vision, no earache, sore throat or sinus congestion. NECK: No pain or stiffness. CARDIOVASCULAR: No chest pain or pressure. No palpitations. PULMONARY: No shortness of breath, cough or wheeze. GASTROINTESTINAL: No abdominal pain, nausea, vomiting or diarrhea, melena or bright red blood per rectum. GENITOURINARY: No urinary frequency, urgency, hesitancy or dysuria. MUSCULOSKELETAL: No joint or muscle pain, no back pain, no recent trauma. DERMATOLOGIC: No rash, no itching, no lesions. ENDOCRINE: No polyuria, polydipsia, no heat or cold intolerance. No recent change in weight. HEMATOLOGICAL: No anemia or easy bruising or bleeding. NEUROLOGIC: No headache, seizures, numbness, tingling or weakness. PSYCHIATRIC: No depression, no loss of interest in normal activity or change in sleep pattern.     Exam:   BP 120/68   Pulse 93   Resp 16   Ht 5' 2.25" (1.581 m)   Wt 175 lb (79.4  kg)   LMP 08/16/2021 (Exact Date)   BMI 31.75 kg/m   Body mass index is 31.75 kg/m.  General appearance : Well developed well nourished female. No acute distress HEENT: Eyes: no retinal hemorrhage or exudates,  Neck supple, trachea midline, no carotid bruits, no thyroidmegaly Lungs: Clear to auscultation, no rhonchi or wheezes, or rib retractions  Heart: Regular rate and rhythm, no murmurs or gallops Breast:Examined in sitting and supine position were symmetrical in appearance, no palpable masses or tenderness,  no skin retraction, no nipple inversion, no nipple discharge, no skin discoloration, no axillary or supraclavicular lymphadenopathy Abdomen: no palpable masses or tenderness, no rebound or guarding Extremities: no edema or skin discoloration or tenderness  Pelvic: Vulva: Normal             Vagina: No gross lesions or discharge  Cervix: No gross lesions or discharge.  Pap reflex done.  Uterus  AV, normal size, shape and consistency, non-tender and mobile  Adnexa  Without masses or tenderness  Anus: Normal   Assessment/Plan:  40 y.o. female for annual exam   1. Encounter for routine gynecological examination with Papanicolaou smear of cervix Normal gynecologic exam.  Pap reflex done.  Breast exam normal.  Will start screening mammogram at age 10 next year.  Health labs with family physician. -  Cytology - PAP( [facility])  2. Encounter for counseling and instruction in natural family planning to avoid pregnancy Declines contraception.  Using natural method.  3. Class 1 obesity due to excess calories without serious comorbidity with body mass index (BMI) of 31.0 to 31.9 in adult  Recommend a lower calorie/carb diet.  Aerobic activities 5 times a week and light weightlifting every 2 days.  Genia Del MD, 10:49 AM 09/07/2021

## 2021-09-08 LAB — CYTOLOGY - PAP: Diagnosis: NEGATIVE

## 2021-09-10 ENCOUNTER — Encounter: Payer: Self-pay | Admitting: Obstetrics & Gynecology

## 2021-09-21 ENCOUNTER — Other Ambulatory Visit: Payer: Self-pay

## 2021-09-21 ENCOUNTER — Encounter: Payer: Self-pay | Admitting: Emergency Medicine

## 2021-09-21 ENCOUNTER — Ambulatory Visit (INDEPENDENT_AMBULATORY_CARE_PROVIDER_SITE_OTHER): Payer: BC Managed Care – PPO | Admitting: Emergency Medicine

## 2021-09-21 VITALS — BP 118/80 | HR 98 | Temp 99.0°F | Ht 62.0 in | Wt 176.0 lb

## 2021-09-21 DIAGNOSIS — Z1329 Encounter for screening for other suspected endocrine disorder: Secondary | ICD-10-CM | POA: Diagnosis not present

## 2021-09-21 DIAGNOSIS — Z130 Encounter for screening for diseases of the blood and blood-forming organs and certain disorders involving the immune mechanism: Secondary | ICD-10-CM

## 2021-09-21 DIAGNOSIS — Z114 Encounter for screening for human immunodeficiency virus [HIV]: Secondary | ICD-10-CM | POA: Diagnosis not present

## 2021-09-21 DIAGNOSIS — Z1159 Encounter for screening for other viral diseases: Secondary | ICD-10-CM

## 2021-09-21 DIAGNOSIS — Z0000 Encounter for general adult medical examination without abnormal findings: Secondary | ICD-10-CM

## 2021-09-21 DIAGNOSIS — Z13220 Encounter for screening for lipoid disorders: Secondary | ICD-10-CM | POA: Diagnosis not present

## 2021-09-21 DIAGNOSIS — Z13228 Encounter for screening for other metabolic disorders: Secondary | ICD-10-CM

## 2021-09-21 LAB — LIPID PANEL
Cholesterol: 216 mg/dL — ABNORMAL HIGH (ref 0–200)
HDL: 34.3 mg/dL — ABNORMAL LOW (ref >39.00)
NonHDL: 181.71
Total CHOL/HDL Ratio: 6
Triglycerides: 352 mg/dL — ABNORMAL HIGH (ref 0.0–149.0)
VLDL: 70.4 mg/dL — ABNORMAL HIGH (ref 0.0–40.0)

## 2021-09-21 LAB — CBC WITH DIFFERENTIAL/PLATELET
Basophils Absolute: 0.1 10*3/uL (ref 0.0–0.1)
Basophils Relative: 1 % (ref 0.0–3.0)
Eosinophils Absolute: 0.3 10*3/uL (ref 0.0–0.7)
Eosinophils Relative: 2.1 % (ref 0.0–5.0)
HCT: 36 % (ref 36.0–46.0)
Hemoglobin: 11.4 g/dL — ABNORMAL LOW (ref 12.0–15.0)
Lymphocytes Relative: 18.6 % (ref 12.0–46.0)
Lymphs Abs: 2.3 10*3/uL (ref 0.7–4.0)
MCHC: 31.7 g/dL (ref 30.0–36.0)
MCV: 77.6 fl — ABNORMAL LOW (ref 78.0–100.0)
Monocytes Absolute: 1.1 10*3/uL — ABNORMAL HIGH (ref 0.1–1.0)
Monocytes Relative: 8.9 % (ref 3.0–12.0)
Neutro Abs: 8.4 10*3/uL — ABNORMAL HIGH (ref 1.4–7.7)
Neutrophils Relative %: 69.4 % (ref 43.0–77.0)
Platelets: 463 10*3/uL — ABNORMAL HIGH (ref 150.0–400.0)
RBC: 4.64 Mil/uL (ref 3.87–5.11)
RDW: 14.6 % (ref 11.5–15.5)
WBC: 12.1 10*3/uL — ABNORMAL HIGH (ref 4.0–10.5)

## 2021-09-21 LAB — COMPREHENSIVE METABOLIC PANEL
ALT: 13 U/L (ref 0–35)
AST: 16 U/L (ref 0–37)
Albumin: 4.5 g/dL (ref 3.5–5.2)
Alkaline Phosphatase: 67 U/L (ref 39–117)
BUN: 10 mg/dL (ref 6–23)
CO2: 25 mEq/L (ref 19–32)
Calcium: 9.8 mg/dL (ref 8.4–10.5)
Chloride: 101 mEq/L (ref 96–112)
Creatinine, Ser: 0.67 mg/dL (ref 0.40–1.20)
GFR: 110.21 mL/min (ref >60.00)
Glucose, Bld: 81 mg/dL (ref 70–99)
Potassium: 3.8 mEq/L (ref 3.5–5.1)
Sodium: 135 mEq/L (ref 135–145)
Total Bilirubin: 0.3 mg/dL (ref 0.2–1.2)
Total Protein: 7.9 g/dL (ref 6.0–8.3)

## 2021-09-21 LAB — HEMOGLOBIN A1C: Hgb A1c MFr Bld: 5.8 % (ref 4.6–6.5)

## 2021-09-21 LAB — TSH: TSH: 1.31 u[IU]/mL (ref 0.35–5.50)

## 2021-09-21 LAB — LDL CHOLESTEROL, DIRECT: Direct LDL: 135 mg/dL

## 2021-09-21 NOTE — Progress Notes (Signed)
Cassandra Coffey 40 y.o.   Chief Complaint  Patient presents with   Annual Exam    HISTORY OF PRESENT ILLNESS: This is a 40 y.o. female here for annual exam. No chronic medical problems.  No chronic medications. Healthy female with a healthy lifestyle. Recently saw gynecologist with no significant or abnormal findings. Has no complaints or medical concerns today.  HPI   Prior to Admission medications   Medication Sig Start Date End Date Taking? Authorizing Provider  VITAMIN D PO Take 1 tablet by mouth daily. Patient not taking: No sig reported    [provider]    No Known Allergies  Patient Active Problem List   Diagnosis Date Noted   Dyslipidemia 09/19/2019   Iron deficiency anemia 09/19/2019   Elevated cholesterol 09/27/2017    Past Medical History:  Diagnosis Date   Anemia     No past surgical history on file.  Social History   Socioeconomic History   Marital status: Married    Spouse name: Not on file   Number of children: Not on file   Years of education: Not on file   Highest education level: Not on file  Occupational History   Not on file  Tobacco Use   Smoking status: Never   Smokeless tobacco: Never  Vaping Use   Vaping Use: Never used  Substance and Sexual Activity   Alcohol use: No    Alcohol/week: 0.0 standard drinks   Drug use: No   Sexual activity: Yes    Partners: Male    Birth control/protection: Condom    Comment: condoms occ  Other Topics Concern   Not on file  Social History Narrative   Not on file   Social Determinants of Health   Financial Resource Strain: Not on file  Food Insecurity: Not on file  Transportation Needs: Not on file  Physical Activity: Not on file  Stress: Not on file  Social Connections: Not on file  Intimate Partner Violence: Not on file    Family History  Problem Relation Age of Onset   Cancer Maternal Aunt        OVARIAN   Breast cancer Maternal Aunt      Review of  Systems  Constitutional: Negative.  Negative for chills and fever.  HENT: Negative.  Negative for congestion and sore throat.   Respiratory: Negative.  Negative for cough and shortness of breath.   Cardiovascular: Negative.  Negative for chest pain and palpitations.  Gastrointestinal: Negative.  Negative for abdominal pain, blood in stool, diarrhea, melena, nausea and vomiting.  Genitourinary: Negative.  Negative for dysuria and hematuria.  Musculoskeletal: Negative.  Negative for joint pain and myalgias.  Skin: Negative.  Negative for rash.  Neurological: Negative.  Negative for dizziness and headaches.  All other systems reviewed and are negative.  Today's Vitals   09/21/21 1322  BP: 118/80  Pulse: 98  Temp: 99 F (37.2 C)  TempSrc: Oral  SpO2: 100%  Weight: 176 lb (79.8 kg)  Height: 5\' 2"  (1.575 m)   Body mass index is 32.19 kg/m.  Physical Exam Vitals reviewed.  Constitutional:      Appearance: Normal appearance.  HENT:     Head: Normocephalic.     Right Ear: Tympanic membrane, ear canal and external ear normal.     Left Ear: Tympanic membrane, ear canal and external ear normal.     Mouth/Throat:     Mouth: Mucous membranes are moist.     Pharynx: Oropharynx is  clear.  Eyes:     Extraocular Movements: Extraocular movements intact.     Conjunctiva/sclera: Conjunctivae normal.     Pupils: Pupils are equal, round, and reactive to light.  Cardiovascular:     Rate and Rhythm: Normal rate and regular rhythm.     Pulses: Normal pulses.     Heart sounds: Normal heart sounds.  Pulmonary:     Effort: Pulmonary effort is normal.     Breath sounds: Normal breath sounds.  Abdominal:     General: There is no distension.     Palpations: Abdomen is soft. There is no mass.     Tenderness: There is no abdominal tenderness.  Musculoskeletal:        General: Normal range of motion.     Cervical back: Normal range of motion and neck supple. No tenderness.     Right lower leg: No  edema.     Left lower leg: No edema.  Lymphadenopathy:     Cervical: No cervical adenopathy.  Skin:    General: Skin is warm and dry.     Capillary Refill: Capillary refill takes less than 2 seconds.  Neurological:     General: No focal deficit present.     Mental Status: She is alert and oriented to person, place, and time.  Psychiatric:        Mood and Affect: Mood normal.        Behavior: Behavior normal.     ASSESSMENT & PLAN: Problem List Items Addressed This Visit   None Visit Diagnoses     Routine general medical examination at a health care facility    -  Primary   Need for hepatitis C screening test       Relevant Orders   Hepatitis C antibody screen   Screening for HIV (human immunodeficiency virus)       Relevant Orders   HIV antibody   Screening for deficiency anemia       Relevant Orders   CBC with Differential   Screening for lipoid disorders       Relevant Orders   Lipid panel   Screening for endocrine, metabolic and immunity disorder       Relevant Orders   Comprehensive metabolic panel   Hemoglobin A1c   TSH      Modifiable risk factors discussed with patient. Anticipatory guidance according to age provided. The following topics were also discussed: Social Determinants of Health Smoking.  Non-smoker Diet and nutrition and need to decrease amount of daily carbohydrate intake Benefits of exercise Cancer family history Vaccinations recommendations Cardiovascular risk assessment Mental health including depression and anxiety Fall and accident prevention  Patient Instructions  Mantenimiento de Radiographer, therapeutic en las mujeres Health Maintenance, Female Adoptar un estilo de vida saludable y recibir atencin preventiva son importantes para promover la salud y Counsellor. Consulte al mdico sobre: El esquema adecuado para hacerse pruebas y exmenes peridicos. Cosas que puede hacer por su cuenta para prevenir enfermedades y [employer]. Qu debo  saber sobre la dieta, el peso y el ejercicio? Consuma una dieta saludable  Consuma una dieta que incluya muchas verduras, frutas, productos lcteos con bajo contenido de [place] y Associate Professor. No consuma muchos alimentos ricos en grasas slidas, azcares agregados o sodio. Mantenga un peso saludable El ndice de masa muscular [facility]) se [place] para identificar problemas de [place]. Proporciona una estimacin de la grasa corporal basndose en el peso y la altura. Su mdico puede ayudarle a Engineer, site  [facility] y a Personnel officer o Pharmacologist un peso saludable. Haga ejercicio con regularidad Haga ejercicio con regularidad. Esta es una de las prcticas ms importantes que puede hacer por su salud. La [employer] de los adultos deben seguir estas pautas: Education officer, environmental, al menos, 150 minutos de actividad fsica por semana. El ejercicio debe aumentar la frecuencia cardaca y Media planner transpirar (ejercicio de intensidad moderada). Hacer ejercicios de fortalecimiento por lo [employer] por semana. Agregue esto a su plan de ejercicio de intensidad moderada. Pasar menos tiempo sentados. Incluso la actividad fsica ligera puede ser beneficiosa. Controle sus niveles de colesterol y lpidos en la sangre Comience a realizarse anlisis de lpidos y Oncologist en la sangre a los 20 aos y luego reptalos cada 5 aos. Hgase controlar los niveles de colesterol con mayor frecuencia si: Sus niveles de lpidos y colesterol son altos. Es mayor de 40 aos. Presenta un alto riesgo de padecer enfermedades cardacas. Qu debo saber sobre las pruebas de deteccin del cncer? Segn su historia clnica y sus antecedentes familiares, es posible que deba realizarse pruebas de deteccin del cncer en diferentes edades. Esto puede incluir pruebas de deteccin de lo siguiente: Cncer de mama. Cncer de cuello uterino. Cncer colorrectal. Cncer de piel. Cncer de pulmn. Qu debo saber sobre la enfermedad cardaca, la diabetes y la  hipertensin arterial? Presin arterial y enfermedad cardaca La hipertensin arterial causa enfermedades cardacas y [place] el riesgo de accidente cerebrovascular. Es ms probable que esto se manifieste en las personas que tienen lecturas de presin arterial alta, tienen ascendencia africana o tienen sobrepeso. Hgase controlar la presin arterial: Cada 3 a 5 aos si tiene entre 18 y 56 aos. Todos los aos si es mayor de 40 aos. Diabetes Realcese exmenes de deteccin de la diabetes con regularidad. Este anlisis revisa el nivel de azcar en la sangre en [place]. Hgase las pruebas de deteccin: Cada tres aos despus de los 40 aos de edad si tiene un peso normal y un bajo riesgo de padecer diabetes. Con ms frecuencia y a partir de [place] edad inferior si tiene sobrepeso o un alto riesgo de padecer diabetes. Qu debo saber sobre la prevencin de infecciones? Hepatitis B Si tiene un riesgo ms alto de contraer hepatitis B, debe someterse a un examen de deteccin de este virus. Hable con el mdico para averiguar si tiene riesgo de contraer la infeccin por hepatitis B. Hepatitis C Se recomienda el anlisis a: [employer] 1945 y 1965. Todas las personas que tengan un riesgo de haber contrado hepatitis C. Enfermedades de transmisin sexual (ETS) Hgase las pruebas de Airline pilot de ITS, incluidas la gonorrea y la clamidia, si: Es sexualmente activa y es menor de [address]. Es mayor de [address], y Public affairs consultant informa que corre riesgo de tener este tipo de infecciones. La actividad sexual ha cambiado desde que le hicieron la ltima prueba de deteccin y tiene un riesgo mayor de Warehouse manager clamidia o Copy. Pregntele al mdico si usted tiene riesgo. Pregntele al mdico si usted tiene un alto riesgo de Primary school teacher VIH. El mdico tambin puede recomendarle un medicamento recetado para ayudar a evitar la infeccin por el VIH. Si elige tomar medicamentos para prevenir el VIH, primero debe  [employer] de deteccin del VIH. Luego debe hacerse anlisis cada 3 meses mientras est tomando los medicamentos. Embarazo Si est por dejar de Armed forces training and education officer (fase premenopusica) y usted puede quedar [place], busque asesoramiento antes de [place]. [place] de 400 a 800 microgramos (  mcg) de cido flico todos los [address] si [place]. Pida mtodos de control de la natalidad (anticonceptivos) si desea evitar un embarazo no deseado. Osteoporosis y [place] La osteoporosis es una enfermedad en la que los huesos pierden los minerales y la fuerza por el avance de la edad. El resultado pueden ser fracturas en los [place]. Si tiene 65 aos o ms, o si est en riesgo de sufrir osteoporosis y fracturas, pregunte a su mdico si debe: Hacerse pruebas de deteccin de prdida sea. Tomar un suplemento de calcio o de vitamina D para reducir el riesgo de fracturas. Recibir terapia de reemplazo hormonal (TRH) para tratar los sntomas de la menopausia. Siga estas instrucciones en su casa: Estilo de vida No consuma ningn producto que contenga nicotina o tabaco, como cigarrillos, cigarrillos electrnicos y tabaco de Theatre manager. Si necesita ayuda para dejar de fumar, consulte al mdico. No consuma drogas. No comparta agujas. Solicite ayuda a su mdico si necesita apoyo o informacin para abandonar las drogas. Consumo de alcohol No beba alcohol si: Su mdico le indica no hacerlo. Est embarazada, puede estar embarazada o est tratando de [place]. Si bebe alcohol: Limite la cantidad que consume de 0 a 1 medida por da. Limite la ingesta si est amamantando. Est atento a la cantidad de alcohol que hay en las bebidas que toma. En los [address], una medida equivale a una botella de cerveza de 12 oz (355 ml), un vaso de vino de 5 oz (148 ml) o un vaso de una bebida alcohlica de alta graduacin de 1 oz (44 ml). Instrucciones generales Realcese los estudios de rutina de la salud,  dentales y de Wellsite geologist. Mantngase al da con las vacunas. Infrmele a su mdico si: Se siente deprimida con frecuencia. Alguna vez ha sido vctima de Pr[place] o no se siente segura en su casa. Resumen Adoptar un estilo de vida saludable y recibir atencin preventiva son importantes para promover la salud y Counsellor. Siga las instrucciones del mdico acerca de una dieta saludable, el ejercicio y la realizacin de pruebas o exmenes para Hotel manager. Siga las instrucciones del mdico con respecto al control del colesterol y la presin arterial. Esta informacin no tiene Theme park manager el consejo del mdico. Asegrese de hacerle al mdico cualquier pregunta que tenga. Document Revised: 12/05/2018 Document Reviewed: 12/05/2018 Elsevier Patient Education  2022 Elsevier Inc.     Edwina Barth, MD [facility]

## 2021-09-21 NOTE — Patient Instructions (Signed)
Mantenimiento de la salud en las mujeres Health Maintenance, Female Adoptar un estilo de vida saludable y recibir atencin preventiva son importantes para promover la salud y el bienestar. Consulte al mdico sobre: El esquema adecuado para hacerse pruebas y exmenes peridicos. Cosas que puede hacer por su cuenta para prevenir enfermedades y mantenerse sana. Qu debo saber sobre la dieta, el peso y el ejercicio? Consuma una dieta saludable  Consuma una dieta que incluya muchas verduras, frutas, productos lcteos con bajo contenido de grasa y protenas magras. No consuma muchos alimentos ricos en grasas slidas, azcares agregados o sodio.  Mantenga un peso saludable El ndice de masa muscular (IMC) se utiliza para identificar problemas de peso. Proporciona una estimacin de la grasa corporal basndose en el peso y la altura. Su mdico puede ayudarle a determinar su IMC y a lograr o mantener unpeso saludable. Haga ejercicio con regularidad Haga ejercicio con regularidad. Esta es una de las prcticas ms importantes que puede hacer por su salud. La mayora de los adultos deben seguir estas pautas: Realizar, al menos, 150minutos de actividad fsica por semana. El ejercicio debe aumentar la frecuencia cardaca y hacerlo transpirar (ejercicio de intensidad moderada). Hacer ejercicios de fortalecimiento por lo menos dos veces por semana. Agregue esto a su plan de ejercicio de intensidad moderada. Pasar menos tiempo sentados. Incluso la actividad fsica ligera puede ser beneficiosa. Controle sus niveles de colesterol y lpidos en la sangre Comience a realizarse anlisis de lpidos y colesterol en la sangre a los20aos y luego reptalos cada 5aos. Hgase controlar los niveles de colesterol con mayor frecuencia si: Sus niveles de lpidos y colesterol son altos. Es mayor de 40aos. Presenta un alto riesgo de padecer enfermedades cardacas. Qu debo saber sobre las pruebas de deteccin del  cncer? Segn su historia clnica y sus antecedentes familiares, es posible que deba realizarse pruebas de deteccin del cncer en diferentes edades. Esto puede incluir pruebas de deteccin de lo siguiente: Cncer de mama. Cncer de cuello uterino. Cncer colorrectal. Cncer de piel. Cncer de pulmn. Qu debo saber sobre la enfermedad cardaca, la diabetes y la hipertensinarterial? Presin arterial y enfermedad cardaca La hipertensin arterial causa enfermedades cardacas y aumenta el riesgo de accidente cerebrovascular. Es ms probable que esto se manifieste en las personas que tienen lecturas de presin arterial alta, tienen ascendencia africana o tienen sobrepeso. Hgase controlar la presin arterial: Cada 3 a 5 aos si tiene entre 18 y 39 aos. Todos los aos si es mayor de 40aos. Diabetes Realcese exmenes de deteccin de la diabetes con regularidad. Este anlisis revisa el nivel de azcar en la sangre en ayunas. Hgase las pruebas de deteccin: Cada tresaos despus de los 40aos de edad si tiene un peso normal y un bajo riesgo de padecer diabetes. Con ms frecuencia y a partir de una edad inferior si tiene sobrepeso o un alto riesgo de padecer diabetes. Qu debo saber sobre la prevencin de infecciones? Hepatitis B Si tiene un riesgo ms alto de contraer hepatitis B, debe someterse a un examen de deteccin de este virus. Hable con el mdico para averiguar si tiene riesgode contraer la infeccin por hepatitis B. Hepatitis C Se recomienda el anlisis a: Todos los que nacieron entre 1945 y 1965. Todas las personas que tengan un riesgo de haber contrado hepatitis C. Enfermedades de transmisin sexual (ETS) Hgase las pruebas de deteccin de ITS, incluidas la gonorrea y la clamidia, si: Es sexualmente activa y es menor de 24aos. Es mayor de 24aos, y el mdico   le informa que corre riesgo de tener este tipo de infecciones. La actividad sexual ha cambiado desde que le  hicieron la ltima prueba de deteccin y tiene un riesgo mayor de tener clamidia o gonorrea. Pregntele al mdico si usted tiene riesgo. Pregntele al mdico si usted tiene un alto riesgo de contraer VIH. El mdico tambin puede recomendarle un medicamento recetado para ayudar a evitar la infeccin por el VIH. Si elige tomar medicamentos para prevenir el VIH, primero debe hacerse los anlisis de deteccin del VIH. Luego debe hacerse anlisis cada 3meses mientras est tomando los medicamentos. Embarazo Si est por dejar de menstruar (fase premenopusica) y usted puede quedar embarazada, busque asesoramiento antes de quedar embarazada. Tome de 400 a 800microgramos (mcg) de cido flico todos los das si queda embarazada. Pida mtodos de control de la natalidad (anticonceptivos) si desea evitar un embarazo no deseado. Osteoporosis y menopausia La osteoporosis es una enfermedad en la que los huesos pierden los minerales y la fuerza por el avance de la edad. El resultado pueden ser fracturas en los huesos. Si tiene 65aos o ms, o si est en riesgo de sufrir osteoporosis y fracturas, pregunte a su mdico si debe: Hacerse pruebas de deteccin de prdida sea. Tomar un suplemento de calcio o de vitamina D para reducir el riesgo de fracturas. Recibir terapia de reemplazo hormonal (TRH) para tratar los sntomas de la menopausia. Siga estas instrucciones en su casa: Estilo de vida No consuma ningn producto que contenga nicotina o tabaco, como cigarrillos, cigarrillos electrnicos y tabaco de mascar. Si necesita ayuda para dejar de fumar, consulte al mdico. No consuma drogas. No comparta agujas. Solicite ayuda a su mdico si necesita apoyo o informacin para abandonar las drogas. Consumo de alcohol No beba alcohol si: Su mdico le indica no hacerlo. Est embarazada, puede estar embarazada o est tratando de quedar embarazada. Si bebe alcohol: Limite la cantidad que consume de 0 a 1 medida por  da. Limite la ingesta si est amamantando. Est atento a la cantidad de alcohol que hay en las bebidas que toma. En los [place], una medida equivale a una botella de cerveza de 12oz (355ml), un vaso de vino de 5oz (148ml) o un vaso de una bebida alcohlica de alta graduacin de 1oz (44ml). Instrucciones generales Realcese los estudios de rutina de la salud, dentales y de la vista. Mantngase al da con las vacunas. Infrmele a su mdico si: Se siente deprimida con frecuencia. Alguna vez ha sido vctima de maltrato o no se siente segura en su casa. Resumen Adoptar un estilo de vida saludable y recibir atencin preventiva son importantes para promover la salud y el bienestar. Siga las instrucciones del mdico acerca de una dieta saludable, el ejercicio y la realizacin de pruebas o exmenes para detectar enfermedades. Siga las instrucciones del mdico con respecto al control del colesterol y la presin arterial. Esta informacin no tiene como fin reemplazar el consejo del mdico. Asegresede hacerle al mdico cualquier pregunta que tenga. Document Revised: 12/05/2018 Document Reviewed: 12/05/2018 Elsevier Patient Education  2022 Elsevier Inc.  

## 2021-09-22 LAB — HIV ANTIBODY (ROUTINE TESTING W REFLEX): HIV 1&2 Ab, 4th Generation: NONREACTIVE

## 2021-09-22 LAB — HEPATITIS C ANTIBODY
Hepatitis C Ab: NONREACTIVE
SIGNAL TO CUT-OFF: 0.09 (ref <1.00)

## 2022-09-08 ENCOUNTER — Ambulatory Visit: Payer: BC Managed Care – PPO | Admitting: Obstetrics & Gynecology

## 2022-12-13 ENCOUNTER — Ambulatory Visit: Payer: BC Managed Care – PPO | Admitting: Obstetrics & Gynecology

## 2022-12-15 ENCOUNTER — Ambulatory Visit: Payer: BC Managed Care – PPO | Admitting: Obstetrics & Gynecology

## 2023-01-25 ENCOUNTER — Encounter: Payer: Self-pay | Admitting: Obstetrics & Gynecology

## 2023-01-25 ENCOUNTER — Ambulatory Visit (INDEPENDENT_AMBULATORY_CARE_PROVIDER_SITE_OTHER): Payer: BC Managed Care – PPO | Admitting: Obstetrics & Gynecology

## 2023-01-25 VITALS — BP 108/70 | HR 99 | Ht 62.25 in | Wt 176.0 lb

## 2023-01-25 DIAGNOSIS — Z01419 Encounter for gynecological examination (general) (routine) without abnormal findings: Secondary | ICD-10-CM | POA: Diagnosis not present

## 2023-01-25 DIAGNOSIS — Z3002 Counseling and instruction in natural family planning to avoid pregnancy: Secondary | ICD-10-CM

## 2023-01-25 NOTE — Progress Notes (Signed)
Cassandra Coffey 04/16/1981 [id number]   History: 42 y.o. G2P2L2 Married.  Daughter 61 yo.  Son 39 yo.   RP:  Established patient presenting for annual gyn exam    HPI: Using natural method for contraception, ok if conceives. Menses normal every 25 days.  No BTB.  No pelvic pain. Pap Neg 08/2021. Will repeat at 3 years. Paragard IUD migration to the EO, removed on 12/30/2019. Urine/BMs normal.  Breasts normal.  [facility]  BMI 31.93. Walking. Fasting Health Labs with Fam MD.  Past medical history,surgical history, family history and social history were all reviewed and documented in the EPIC chart.  Gynecologic History Patient's last menstrual period was 01/07/2023 (exact date).  Obstetric History OB History  Gravida Para Term Preterm AB Living  '2 2 2     2  '$ SAB IAB Ectopic Multiple Live Births               # Outcome Date GA Lbr Len/2nd Weight Sex Delivery Anes PTL Lv  2 Term           1 Term              ROS: A ROS was performed and pertinent positives and negatives are included in the history. GENERAL: No fevers or chills. HEENT: No change in vision, no earache, sore throat or sinus congestion. NECK: No pain or stiffness. CARDIOVASCULAR: No chest pain or pressure. No palpitations. PULMONARY: No shortness of breath, cough or wheeze. GASTROINTESTINAL: No abdominal pain, nausea, vomiting or diarrhea, melena or bright red blood per rectum. GENITOURINARY: No urinary frequency, urgency, hesitancy or dysuria. MUSCULOSKELETAL: No joint or muscle pain, no back pain, no recent trauma. DERMATOLOGIC: No rash, no itching, no lesions. ENDOCRINE: No polyuria, polydipsia, no heat or cold intolerance. No recent change in weight. HEMATOLOGICAL: No anemia or easy bruising or bleeding. NEUROLOGIC: No headache, seizures, numbness, tingling or weakness. PSYCHIATRIC: No depression, no loss of interest in normal activity or change in sleep pattern.      Exam:   BP 108/70   Pulse 99   Ht 5' 2.25" (1.581 m)   Wt 176 lb (79.8 kg)   LMP 01/07/2023 (Exact Date) Comment: sexually active-rhythm method  SpO2 97%   BMI 31.93 kg/m   Body mass index is 31.93 kg/m.  General appearance : Well developed well nourished female. No acute distress HEENT: Eyes: no retinal hemorrhage or exudates,  Neck supple, trachea midline, no carotid bruits, no thyroidmegaly Lungs: Clear to auscultation, no rhonchi or wheezes, or rib retractions  Heart: Regular rate and rhythm, no murmurs or gallops Breast:Examined in sitting and supine position were symmetrical in appearance, no palpable masses or tenderness,  no skin retraction, no nipple inversion, no nipple discharge, no skin discoloration, no axillary or supraclavicular lymphadenopathy Abdomen: no palpable masses or tenderness, no rebound or guarding Extremities: no edema or skin discoloration or tenderness  Pelvic: Vulva: Normal             Vagina: No gross lesions or discharge  Cervix: No gross lesions or discharge  Uterus  AV, normal size, shape and consistency, non-tender and mobile  Adnexa  Without masses or tenderness  Anus: Normal   Assessment/Plan:  42 y.o. female for annual exam   1. Well female exam with routine gynecological exam Using natural method for contraception, ok if conceives. Menses normal every 25 days.  No BTB.  No pelvic pain. Pap Neg 08/2021.  Will repeat at 3 years. Paragard IUD migration to the EO, removed on 12/30/2019. Urine/BMs normal.  Breasts normal.  [facility]  BMI 31.93. Walking. Fasting Health Labs with Fam MD.  2. Encounter for counseling and instruction in natural family planning to avoid pregnancy   Princess Bruins MD, 12:10 PM

## 2023-08-03 ENCOUNTER — Encounter: Payer: BC Managed Care – PPO | Admitting: Emergency Medicine

## 2023-08-09 ENCOUNTER — Encounter: Payer: Self-pay | Admitting: Emergency Medicine

## 2023-08-09 ENCOUNTER — Ambulatory Visit (INDEPENDENT_AMBULATORY_CARE_PROVIDER_SITE_OTHER): Payer: BC Managed Care – PPO | Admitting: Emergency Medicine

## 2023-08-09 VITALS — BP 116/70 | HR 95 | Temp 99.2°F | Ht 62.25 in | Wt 174.5 lb

## 2023-08-09 DIAGNOSIS — Z0000 Encounter for general adult medical examination without abnormal findings: Secondary | ICD-10-CM

## 2023-08-09 DIAGNOSIS — Z13220 Encounter for screening for lipoid disorders: Secondary | ICD-10-CM | POA: Diagnosis not present

## 2023-08-09 DIAGNOSIS — Z130 Encounter for screening for diseases of the blood and blood-forming organs and certain disorders involving the immune mechanism: Secondary | ICD-10-CM | POA: Diagnosis not present

## 2023-08-09 DIAGNOSIS — Z13228 Encounter for screening for other metabolic disorders: Secondary | ICD-10-CM | POA: Diagnosis not present

## 2023-08-09 DIAGNOSIS — Z1329 Encounter for screening for other suspected endocrine disorder: Secondary | ICD-10-CM | POA: Diagnosis not present

## 2023-08-09 NOTE — Progress Notes (Signed)
Cassandra Coffey 42 y.o.   Chief Complaint  Patient presents with   Annual Exam    No concerns     HISTORY OF PRESENT ILLNESS: This is a 42 y.o. female boricua from [facility] here for annual exam Healthy female with a healthy lifestyle Has no complaints or medical concerns today.  HPI   Prior to Admission medications   Not on File    No Known Allergies  Patient Active Problem List   Diagnosis Date Noted   Dyslipidemia 09/19/2019   Iron deficiency anemia 09/19/2019   Elevated cholesterol 09/27/2017    Past Medical History:  Diagnosis Date   Anemia     No past surgical history on file.  Social History   Socioeconomic History   Marital status: Married    Spouse name: Not on file   Number of children: Not on file   Years of education: Not on file   Highest education level: Not on file  Occupational History   Not on file  Tobacco Use   Smoking status: Never   Smokeless tobacco: Never  Vaping Use   Vaping status: Never Used  Substance and Sexual Activity   Alcohol use: No    Alcohol/week: 0.0 standard drinks of alcohol   Drug use: No   Sexual activity: Yes    Partners: Male    Birth control/protection: None, Rhythm  Other Topics Concern   Not on file  Social History Narrative   Not on file   Social Determinants of Health   Financial Resource Strain: Not on file  Food Insecurity: Not on file  Transportation Needs: Not on file  Physical Activity: Not on file  Stress: Not on file  Social Connections: Not on file  Intimate Partner Violence: Not on file    Family History  Problem Relation Age of Onset   Cancer Maternal Aunt        OVARIAN   Breast cancer Maternal Aunt      Review of Systems  Constitutional: Negative.  Negative for chills and fever.  HENT: Negative.  Negative for congestion and sore throat.   Respiratory: Negative.  Negative for cough and shortness of breath.   Cardiovascular: Negative.  Negative for chest pain and  palpitations.  Gastrointestinal:  Negative for abdominal pain, diarrhea, nausea and vomiting.  Genitourinary: Negative.  Negative for dysuria and hematuria.  Skin: Negative.  Negative for rash.  Neurological: Negative.  Negative for dizziness and headaches.  All other systems reviewed and are negative.   Vitals:   08/09/23 1405  BP: 116/70  Pulse: 95  Temp: 99.2 F (37.3 C)  SpO2: 98%    Physical Exam Vitals reviewed.  Constitutional:      Appearance: Normal appearance.  HENT:     Head: Normocephalic.     Right Ear: Tympanic membrane, ear canal and external ear normal.     Left Ear: Tympanic membrane, ear canal and external ear normal.     Mouth/Throat:     Mouth: Mucous membranes are moist.     Pharynx: Oropharynx is clear.  Eyes:     Extraocular Movements: Extraocular movements intact.     Conjunctiva/sclera: Conjunctivae normal.     Pupils: Pupils are equal, round, and reactive to light.  Cardiovascular:     Rate and Rhythm: Normal rate and regular rhythm.     Pulses: Normal pulses.     Heart sounds: Normal heart sounds.  Pulmonary:     Effort: Pulmonary effort is normal.  Breath sounds: Normal breath sounds.  Abdominal:     Palpations: Abdomen is soft.     Tenderness: There is no abdominal tenderness.  Musculoskeletal:     Cervical back: No tenderness.  Lymphadenopathy:     Cervical: No cervical adenopathy.  Skin:    General: Skin is warm and dry.     Capillary Refill: Capillary refill takes less than 2 seconds.  Neurological:     General: No focal deficit present.     Mental Status: She is alert and oriented to person, place, and time.  Psychiatric:        Mood and Affect: Mood normal.        Behavior: Behavior normal.      ASSESSMENT & PLAN: Problem List Items Addressed This Visit   None Visit Diagnoses     Routine general medical examination at a health care facility    -  Primary   Relevant Orders   CBC with Differential/Platelet    Comprehensive metabolic panel   Lipid panel   Hemoglobin A1c   Vitamin B12   VITAMIN D 25 Hydroxy (Vit-D Deficiency, Fractures)   TSH   Screening for deficiency anemia       Relevant Orders   CBC with Differential/Platelet   Screening for lipoid disorders       Relevant Orders   Lipid panel   Screening for endocrine, metabolic and immunity disorder       Relevant Orders   Comprehensive metabolic panel   Hemoglobin A1c   Vitamin B12   VITAMIN D 25 Hydroxy (Vit-D Deficiency, Fractures)   TSH      Modifiable risk factors discussed with patient. Anticipatory guidance according to age provided. The following topics were also discussed: Social Determinants of Health Smoking.  Non-smoker Diet and nutrition Benefits of exercise Cancer family history review Vaccinations review and recommendations Cardiovascular risk assessment and need for blood work Mental health including depression and anxiety Fall and accident prevention  Patient Instructions  Health Maintenance, Female Adopting a healthy lifestyle and getting preventive care are important in promoting health and wellness. Ask your health care provider about: The right schedule for you to have regular tests and exams. Things you can do on your own to prevent diseases and keep yourself healthy. What should I know about diet, weight, and exercise? Eat a healthy diet  Eat a diet that includes plenty of vegetables, fruits, low-fat dairy products, and lean protein. Do not eat a lot of foods that are high in solid fats, added sugars, or sodium. Maintain a healthy weight Body mass index (BMI) is used to identify weight problems. It estimates body fat based on height and weight. Your health care provider can help determine your BMI and help you achieve or maintain a healthy weight. Get regular exercise Get regular exercise. This is one of the most important things you can do for your health. Most adults should: Exercise for at  least 150 minutes each week. The exercise should increase your heart rate and make you sweat (moderate-intensity exercise). Do strengthening exercises at least twice a week. This is in addition to the moderate-intensity exercise. Spend less time sitting. Even light physical activity can be beneficial. Watch cholesterol and blood lipids Have your blood tested for lipids and cholesterol at 41 years of age, then have this test every 5 years. Have your cholesterol levels checked more often if: Your lipid or cholesterol levels are high. You are older than 41 years of age. You are  at high risk for heart disease. What should I know about cancer screening? Depending on your health history and family history, you may need to have cancer screening at various ages. This may include screening for: Breast cancer. Cervical cancer. Colorectal cancer. Skin cancer. Lung cancer. What should I know about heart disease, diabetes, and high blood pressure? Blood pressure and heart disease High blood pressure causes heart disease and increases the risk of stroke. This is more likely to develop in people who have high blood pressure readings or are overweight. Have your blood pressure checked: Every 3-5 years if you are 46-41 years of age. Every year if you are 67 years old or older. Diabetes Have regular diabetes screenings. This checks your fasting blood sugar level. Have the screening done: Once every three years after age 40 if you are at a normal weight and have a low risk for diabetes. More often and at a younger age if you are overweight or have a high risk for diabetes. What should I know about preventing infection? Hepatitis B If you have a higher risk for hepatitis B, you should be screened for this virus. Talk with your health care provider to find out if you are at risk for hepatitis B infection. Hepatitis C Testing is recommended for: Everyone born from [birth year] through 1965. Anyone with known risk  factors for hepatitis C. Sexually transmitted infections (STIs) Get screened for STIs, including gonorrhea and chlamydia, if: You are sexually active and are younger than 41 years of age. You are older than 41 years of age and your health care provider tells you that you are at risk for this type of infection. Your sexual activity has changed since you were last screened, and you are at increased risk for chlamydia or gonorrhea. Ask your health care provider if you are at risk. Ask your health care provider about whether you are at high risk for HIV. Your health care provider may recommend a prescription medicine to help prevent HIV infection. If you choose to take medicine to prevent HIV, you should first get tested for HIV. You should then be tested every 3 months for as long as you are taking the medicine. Pregnancy If you are about to stop having your period (premenopausal) and you may become pregnant, seek counseling before you get pregnant. Take 400 to 800 micrograms (mcg) of folic acid every day if you become pregnant. Ask for birth control (contraception) if you want to prevent pregnancy. Osteoporosis and menopause Osteoporosis is a disease in which the bones lose minerals and strength with aging. This can result in bone fractures. If you are 41 years old or older, or if you are at risk for osteoporosis and fractures, ask your health care provider if you should: Be screened for bone loss. Take a calcium or vitamin D supplement to lower your risk of fractures. Be given hormone replacement therapy (HRT) to treat symptoms of menopause. Follow these instructions at home: Alcohol use Do not drink alcohol if: Your health care provider tells you not to drink. You are pregnant, may be pregnant, or are planning to become pregnant. If you drink alcohol: Limit how much you have to: 0-1 drink a day. Know how much alcohol is in your drink. In the [place] one drink equals one 12 oz bottle of beer  (355 mL), one 5 oz glass of wine (148 mL), or one 1 oz glass of hard liquor (44 mL). Lifestyle Do not use any products that contain nicotine  or tobacco. These products include cigarettes, chewing tobacco, and vaping devices, such as e-cigarettes. If you need help quitting, ask your health care provider. Do not use street drugs. Do not share needles. Ask your health care provider for help if you need support or information about quitting drugs. General instructions Schedule regular health, dental, and eye exams. Stay current with your vaccines. Tell your health care provider if: You often feel depressed. You have ever been abused or do not feel safe at home. Summary Adopting a healthy lifestyle and getting preventive care are important in promoting health and wellness. Follow your health care provider's instructions about healthy diet, exercising, and getting tested or screened for diseases. Follow your health care provider's instructions on monitoring your cholesterol and blood pressure. This information is not intended to replace advice given to you by your health care provider. Make sure you discuss any questions you have with your health care provider. Document Revised: 04/05/2021 Document Reviewed: 04/05/2021 Elsevier Patient Education  2024 Elsevier Inc.     Edwina Barth, MD [facility]

## 2023-08-09 NOTE — Patient Instructions (Signed)

## 2023-08-10 LAB — LIPID PANEL
Cholesterol: 192 mg/dL (ref 0–200)
HDL: 34.1 mg/dL — ABNORMAL LOW (ref >39.00)
LDL Cholesterol: 122 mg/dL — ABNORMAL HIGH (ref 0–99)
NonHDL: 157.83
Total CHOL/HDL Ratio: 6
Triglycerides: 181 mg/dL — ABNORMAL HIGH (ref 0.0–149.0)
VLDL: 36.2 mg/dL (ref 0.0–40.0)

## 2023-08-10 LAB — COMPREHENSIVE METABOLIC PANEL
ALT: 18 U/L (ref 0–35)
AST: 16 U/L (ref 0–37)
Albumin: 4 g/dL (ref 3.5–5.2)
Alkaline Phosphatase: 66 U/L (ref 39–117)
BUN: 12 mg/dL (ref 6–23)
CO2: 22 meq/L (ref 19–32)
Calcium: 9 mg/dL (ref 8.4–10.5)
Chloride: 106 meq/L (ref 96–112)
Creatinine, Ser: 0.7 mg/dL (ref 0.40–1.20)
GFR: 107.62 mL/min (ref >60.00)
Glucose, Bld: 90 mg/dL (ref 70–99)
Potassium: 4.2 meq/L (ref 3.5–5.1)
Sodium: 139 meq/L (ref 135–145)
Total Bilirubin: 0.2 mg/dL (ref 0.2–1.2)
Total Protein: 7.3 g/dL (ref 6.0–8.3)

## 2023-08-10 LAB — CBC WITH DIFFERENTIAL/PLATELET
Basophils Absolute: 0.1 10*3/uL (ref 0.0–0.1)
Basophils Relative: 0.9 % (ref 0.0–3.0)
Eosinophils Absolute: 0.3 10*3/uL (ref 0.0–0.7)
Eosinophils Relative: 3.5 % (ref 0.0–5.0)
HCT: 34.5 % — ABNORMAL LOW (ref 36.0–46.0)
Hemoglobin: 10.7 g/dL — ABNORMAL LOW (ref 12.0–15.0)
Lymphocytes Relative: 26.4 % (ref 12.0–46.0)
Lymphs Abs: 2.6 10*3/uL (ref 0.7–4.0)
MCHC: 31 g/dL (ref 30.0–36.0)
MCV: 72 fl — ABNORMAL LOW (ref 78.0–100.0)
Monocytes Absolute: 1.1 10*3/uL — ABNORMAL HIGH (ref 0.1–1.0)
Monocytes Relative: 11 % (ref 3.0–12.0)
Neutro Abs: 5.7 10*3/uL (ref 1.4–7.7)
Neutrophils Relative %: 58.2 % (ref 43.0–77.0)
Platelets: 451 10*3/uL — ABNORMAL HIGH (ref 150.0–400.0)
RBC: 4.8 Mil/uL (ref 3.87–5.11)
RDW: 17.3 % — ABNORMAL HIGH (ref 11.5–15.5)
WBC: 9.8 10*3/uL (ref 4.0–10.5)

## 2023-08-10 LAB — HEMOGLOBIN A1C: Hgb A1c MFr Bld: 5.9 % (ref 4.6–6.5)

## 2023-08-10 LAB — TSH: TSH: 2.06 u[IU]/mL (ref 0.35–5.50)

## 2023-08-11 LAB — VITAMIN B12: Vitamin B-12: 346 pg/mL (ref 200–1100)

## 2023-08-11 LAB — VITAMIN D 25 HYDROXY (VIT D DEFICIENCY, FRACTURES): Vit D, 25-Hydroxy: 11 ng/mL — ABNORMAL LOW (ref 30–100)

## 2023-08-13 ENCOUNTER — Other Ambulatory Visit: Payer: Self-pay | Admitting: Emergency Medicine

## 2023-08-13 DIAGNOSIS — E559 Vitamin D deficiency, unspecified: Secondary | ICD-10-CM | POA: Insufficient documentation

## 2023-08-13 MED ORDER — VITAMIN D (ERGOCALCIFEROL) 1.25 MG (50000 UNIT) PO CAPS
50000.0000 [IU] | ORAL_CAPSULE | ORAL | 1 refills | Status: AC
Start: 2023-08-13 — End: ?

## 2024-06-10 ENCOUNTER — Encounter: Payer: Self-pay | Admitting: Emergency Medicine

## 2024-06-10 ENCOUNTER — Ambulatory Visit (INDEPENDENT_AMBULATORY_CARE_PROVIDER_SITE_OTHER)

## 2024-06-10 ENCOUNTER — Ambulatory Visit (INDEPENDENT_AMBULATORY_CARE_PROVIDER_SITE_OTHER): Admitting: Emergency Medicine

## 2024-06-10 VITALS — BP 124/84 | HR 113 | Temp 99.1°F | Ht 62.5 in | Wt 183.0 lb

## 2024-06-10 DIAGNOSIS — R052 Subacute cough: Secondary | ICD-10-CM | POA: Diagnosis not present

## 2024-06-10 DIAGNOSIS — K2100 Gastro-esophageal reflux disease with esophagitis, without bleeding: Secondary | ICD-10-CM | POA: Insufficient documentation

## 2024-06-10 DIAGNOSIS — R1013 Epigastric pain: Secondary | ICD-10-CM | POA: Insufficient documentation

## 2024-06-10 DIAGNOSIS — E559 Vitamin D deficiency, unspecified: Secondary | ICD-10-CM | POA: Diagnosis not present

## 2024-06-10 DIAGNOSIS — D509 Iron deficiency anemia, unspecified: Secondary | ICD-10-CM | POA: Diagnosis not present

## 2024-06-10 DIAGNOSIS — E785 Hyperlipidemia, unspecified: Secondary | ICD-10-CM

## 2024-06-10 DIAGNOSIS — R29818 Other symptoms and signs involving the nervous system: Secondary | ICD-10-CM | POA: Insufficient documentation

## 2024-06-10 LAB — CBC WITH DIFFERENTIAL/PLATELET
Basophils Absolute: 0.1 K/uL (ref 0.0–0.1)
Basophils Relative: 0.9 % (ref 0.0–3.0)
Eosinophils Absolute: 0.3 K/uL (ref 0.0–0.7)
Eosinophils Relative: 3.6 % (ref 0.0–5.0)
HCT: 37.9 % (ref 36.0–46.0)
Hemoglobin: 12.2 g/dL (ref 12.0–15.0)
Lymphocytes Relative: 20.1 % (ref 12.0–46.0)
Lymphs Abs: 1.9 K/uL (ref 0.7–4.0)
MCHC: 32.2 g/dL (ref 30.0–36.0)
MCV: 79.3 fl (ref 78.0–100.0)
Monocytes Absolute: 0.7 K/uL (ref 0.1–1.0)
Monocytes Relative: 7.5 % (ref 3.0–12.0)
Neutro Abs: 6.5 K/uL (ref 1.4–7.7)
Neutrophils Relative %: 67.9 % (ref 43.0–77.0)
Platelets: 391 K/uL (ref 150.0–400.0)
RBC: 4.78 Mil/uL (ref 3.87–5.11)
RDW: 15.6 % — ABNORMAL HIGH (ref 11.5–15.5)
WBC: 9.6 K/uL (ref 4.0–10.5)

## 2024-06-10 LAB — COMPREHENSIVE METABOLIC PANEL WITH GFR
ALT: 17 U/L (ref 0–35)
AST: 15 U/L (ref 0–37)
Albumin: 4.5 g/dL (ref 3.5–5.2)
Alkaline Phosphatase: 67 U/L (ref 39–117)
BUN: 12 mg/dL (ref 6–23)
CO2: 28 meq/L (ref 19–32)
Calcium: 10 mg/dL (ref 8.4–10.5)
Chloride: 102 meq/L (ref 96–112)
Creatinine, Ser: 0.84 mg/dL (ref 0.40–1.20)
GFR: 85.96 mL/min (ref >60.00)
Glucose, Bld: 132 mg/dL — ABNORMAL HIGH (ref 70–99)
Potassium: 3.9 meq/L (ref 3.5–5.1)
Sodium: 138 meq/L (ref 135–145)
Total Bilirubin: 0.3 mg/dL (ref 0.2–1.2)
Total Protein: 7.9 g/dL (ref 6.0–8.3)

## 2024-06-10 LAB — VITAMIN D 25 HYDROXY (VIT D DEFICIENCY, FRACTURES): VITD: 19.74 ng/mL — ABNORMAL LOW (ref 30.00–100.00)

## 2024-06-10 LAB — LIPID PANEL
Cholesterol: 214 mg/dL — ABNORMAL HIGH (ref 0–200)
HDL: 35.9 mg/dL — ABNORMAL LOW (ref >39.00)
LDL Cholesterol: 117 mg/dL — ABNORMAL HIGH (ref 0–99)
NonHDL: 178.32
Total CHOL/HDL Ratio: 6
Triglycerides: 306 mg/dL — ABNORMAL HIGH (ref 0.0–149.0)
VLDL: 61.2 mg/dL — ABNORMAL HIGH (ref 0.0–40.0)

## 2024-06-10 LAB — FERRITIN: Ferritin: 25.5 ng/mL (ref 10.0–291.0)

## 2024-06-10 LAB — VITAMIN B12: Vitamin B-12: 344 pg/mL (ref 211–911)

## 2024-06-10 LAB — HEMOGLOBIN A1C: Hgb A1c MFr Bld: 5.9 % (ref 4.6–6.5)

## 2024-06-10 LAB — FOLATE: Folate: 11.1 ng/mL (ref >5.9)

## 2024-06-10 MED ORDER — PANTOPRAZOLE SODIUM 40 MG PO TBEC: 40.0000 mg | DELAYED_RELEASE_TABLET | Freq: Every day | ORAL | 3 refills | Status: AC

## 2024-06-10 MED ORDER — AZITHROMYCIN 250 MG PO TABS
ORAL_TABLET | ORAL | 0 refills | Status: AC
Start: 2024-06-10 — End: ?

## 2024-06-10 NOTE — Assessment & Plan Note (Signed)
Diet and nutrition discussed.  Lipid profile done today.  

## 2024-06-10 NOTE — Assessment & Plan Note (Signed)
 Loud snoring, wakes up tired, daytime somnolence Apnea episodes witnessed by husband Recommend sleep studies Referral placed today

## 2024-06-10 NOTE — Assessment & Plan Note (Signed)
 Burning epigastric sensation Clinically stable.  No red flag signs or symptoms. Differential diagnosis discussed Diet and nutrition discussed Recommend trial of pantoprazole  40 mg daily May need GI referral

## 2024-06-10 NOTE — Assessment & Plan Note (Signed)
 Normal chest x-ray Infection less likely but will cover with 5 days of daily azithromycin 

## 2024-06-10 NOTE — Progress Notes (Signed)
 Cassandra Coffey 43 y.o.   Chief Complaint  Patient presents with   Sore Throat    Patient states her throat has been really itchy and has to constantly having to cough to clear her throat. Also complaining of upper abdominal pain, she states on June 15 is when she started her menstrual and on June 18 in the morning while in the shower she had 2 grapefruit size blood clots come out.     HISTORY OF PRESENT ILLNESS: This is a 43 y.o. female boricua complaining of itchy burning throat for several weeks accompanied with cough at times productive of a lot of phlegm. Also complaining of burning epigastric pain.  Intermittent symptoms.  No melena. Husband concerned she may also have sleep apnea.  Snores loudly.  Wakes up tired.  Has daytime somnolence  HPI   Prior to Admission medications   Medication Sig Start Date End Date Taking? Authorizing Provider  Vitamin D , Ergocalciferol , (DRISDOL ) 1.25 MG (50000 UNIT) CAPS capsule Take 1 capsule (50,000 Units total) by mouth every 7 (seven) days. 08/13/23  Yes Purcell Emil Schanz, MD    No Known Allergies  Patient Active Problem List   Diagnosis Date Noted   Vitamin D  deficiency 08/13/2023   Dyslipidemia 09/19/2019   Iron deficiency anemia 09/19/2019   Elevated cholesterol 09/27/2017    Past Medical History:  Diagnosis Date   Anemia     History reviewed. No pertinent surgical history.  Social History   Socioeconomic History   Marital status: Married    Spouse name: Not on file   Number of children: Not on file   Years of education: Not on file   Highest education level: Not on file  Occupational History   Not on file  Tobacco Use   Smoking status: Never   Smokeless tobacco: Never  Vaping Use   Vaping status: Never Used  Substance and Sexual Activity   Alcohol use: No    Alcohol/week: 0.0 standard drinks of alcohol   Drug use: No   Sexual activity: Yes    Partners: Male    Birth control/protection: None, Rhythm   Other Topics Concern   Not on file  Social History Narrative   Not on file   Social Drivers of Health   Financial Resource Strain: Not on file  Food Insecurity: Not on file  Transportation Needs: Not on file  Physical Activity: Not on file  Stress: Not on file  Social Connections: Not on file  Intimate Partner Violence: Not on file    Family History  Problem Relation Age of Onset   Cancer Maternal Aunt        OVARIAN   Breast cancer Maternal Aunt      Review of Systems  Constitutional: Negative.  Negative for chills and fever.  HENT:  Positive for sore throat. Negative for congestion.   Respiratory:  Positive for cough.   Cardiovascular: Negative.  Negative for chest pain and palpitations.  Gastrointestinal:  Positive for abdominal pain and heartburn.  Genitourinary: Negative.  Negative for dysuria and hematuria.       Nocturia  Skin: Negative.  Negative for rash.  Neurological: Negative.  Negative for dizziness and headaches.  All other systems reviewed and are negative.   Vitals:   06/10/24 1345  BP: 124/84  Pulse: (!) 113  Temp: 99.1 F (37.3 C)  SpO2: 98%     Physical Exam Vitals reviewed.  Constitutional:      Appearance: Normal appearance.  HENT:  Head: Normocephalic.     Mouth/Throat:     Mouth: Mucous membranes are moist.     Pharynx: Oropharynx is clear.  Eyes:     Extraocular Movements: Extraocular movements intact.     Conjunctiva/sclera: Conjunctivae normal.     Pupils: Pupils are equal, round, and reactive to light.  Cardiovascular:     Rate and Rhythm: Normal rate and regular rhythm.     Pulses: Normal pulses.     Heart sounds: Normal heart sounds.  Pulmonary:     Effort: Pulmonary effort is normal.     Breath sounds: Normal breath sounds.  Abdominal:     Palpations: Abdomen is soft.     Tenderness: There is no abdominal tenderness.  Musculoskeletal:     Cervical back: No tenderness.  Lymphadenopathy:     Cervical: No  cervical adenopathy.  Skin:    General: Skin is warm and dry.     Capillary Refill: Capillary refill takes less than 2 seconds.  Neurological:     General: No focal deficit present.     Mental Status: She is alert and oriented to person, place, and time.  Psychiatric:        Mood and Affect: Mood normal.        Behavior: Behavior normal.   DG Chest 2 View Result Date: 06/10/2024 CLINICAL DATA:  One-month history of anterior chest and upper abdominal pain EXAM: CHEST - 2 VIEW COMPARISON:  Chest radiograph dated 10/03/2019 FINDINGS: Low lung volumes with bronchovascular crowding. No focal consolidations. No pleural effusion or pneumothorax. The heart size and mediastinal contours are within normal limits. No acute osseous abnormality. IMPRESSION: Low lung volumes with bronchovascular crowding. No focal consolidations. Electronically Signed   By: Limin  Xu M.D.   On: 06/10/2024 14:37      ASSESSMENT & PLAN: A total of 44 minutes was spent with the patient and counseling/coordination of care regarding preparing for this visit, review of most recent office visit notes, review of multiple chronic medical conditions and their management, differential diagnosis of epigastric pain and possible GERD diagnosis, possibility of sleep apnea and need for sleep studies, review of all medications, review of most recent bloodwork results, review of health maintenance items, education on nutrition, prognosis, documentation, and need for follow up.   Problem List Items Addressed This Visit       Digestive   Gastroesophageal reflux disease with esophagitis without hemorrhage   Having symptoms of reflux including sore throat, intermittent cough, epigastric burning sensation worse at nighttime Recommend trial of pantoprazole  40 mg daily for 4 to 8 weeks If no improvement, recommend upper endoscopy Diet and nutrition discussed        Other   Dyslipidemia   Diet and nutrition discussed Lipid profile done  today      Relevant Orders   Hemoglobin A1c (Completed)   Lipid panel (Completed)   Iron deficiency anemia   Most likely secondary to heavy menstrual periods. Continue multivitamin with iron supplementation daily      Relevant Orders   Vitamin B12 (Completed)   Folate (Completed)   Iron and TIBC   Ferritin (Completed)   CBC with Differential/Platelet (Completed)   Vitamin D  deficiency   Vitamin D  levels repeated today      Relevant Orders   VITAMIN D  25 Hydroxy (Vit-D Deficiency, Fractures) (Completed)   Epigastric burning sensation - Primary   Burning epigastric sensation Clinically stable.  No red flag signs or symptoms. Differential diagnosis discussed Diet and  nutrition discussed Recommend trial of pantoprazole  40 mg daily May need GI referral      Relevant Orders   CBC with Differential/Platelet (Completed)   Comprehensive metabolic panel with GFR (Completed)   Hemoglobin A1c (Completed)   Suspected sleep apnea   Loud snoring, wakes up tired, daytime somnolence Apnea episodes witnessed by husband Recommend sleep studies Referral placed today      Relevant Orders   Ambulatory referral to Sleep Studies   Subacute cough   Normal chest x-ray Infection less likely but will cover with 5 days of daily azithromycin       Relevant Medications   azithromycin  (ZITHROMAX ) 250 MG tablet   Other Relevant Orders   DG Chest 2 View (Completed)   Patient Instructions  ERGE en adultos: cambios en la dieta GERD in Adults: Diet Changes Cuando una persona tiene enfermedad de reflujo gastroesofgico (ERGE), es posible que deba hacer cambios en su dieta. Elegir los alimentos adecuados puede ayudar a [employer]. Considere recurrir a un experto en alimentacin saludable llamado nutricionista. Este puede ayudarlo a hacer elecciones alimentarias saludables. Consejos para seguir Surveyor, minerals Al leer las etiquetas de los alimentos Elija alimentos que tengan bajo contenido  de grasas saturadas. Los alimentos que pueden ayudar con los sntomas incluyen los siguientes: Alimentos con menos del 5 % de los valores diarios (VD) de grasa. Alimentos con 0 gramos de grasas trans. Al cocinar Cocine los alimentos de formas que no requieran [place]. Estas formas incluyen las siguientes: Hornear. Cocer al vapor. Grillar. Hervir. Para agregar sabor, trate de consumir hierbas con bajo contenido de picante y palau. Evite frer los alimentos. Planificacin de las comidas  Haga comidas pequeas durante Glass blower/designer de hacer 3 comidas abundantes. Coma lentamente y en un lugar donde se sienta relajado. Si se lo indic el mdico, evite: Consumir alimentos que le ocasionen sntomas. Lleve un registro de los alimentos para identificar aquellos que le causen sntomas. Alcohol. Beber mucha cantidad de lquido con las comidas. Instrucciones generales Durante 2 a 3 horas despus de comer, evite: Agacharse. Realizar actividad fsica. Acostarse. Masticar chicle sin azcar despus de las comidas. Qu alimentos debo comer? Siga una dieta saludable. Trate de incluir: Alimentos con gran cantidad de [place]. Esto incluye lo siguiente: [place] y verduras. Cereales integrales y legumbres. Productos lcteos con bajo contenido de grasa. Carne magra, pescado y aves. Claras de huevo. Los alimentos que causan sntomas en otra persona pueden no causarle sntomas a usted. Trabaje con el mdico para hallar alimentos que sean seguros para usted. Es posible que los productos que se enumeran ms arriba no sean todos los alimentos y las bebidas que puede consumir. Consulte a su nutricionista para obtener ms informacin. Es posible que los productos detallados arriba no constituyan una lista completa de los alimentos y las bebidas que puede tomar. Consulte a un nutricionista para obtener ms informacin. Qu alimentos debo evitar? Limitar algunos de estos alimentos puede ayudar a [employer]. Cada persona es diferente. Hable con un nutricionista o con su mdico para que lo ayude a Photographer. Algunos de los [employer] evitar pueden incluir: Frutas Frutas que sean muy cidas. Estas pueden ser las frutas ctricas como la naranja, el pomelo, la pia y el limn. Verduras Verduras fritas, como las papas fritas. Verduras, salsas o aderezos elaborados con grasa agregada y verduras cidas. Estos pueden [employer] y los productos con tomate, el aj, la [place], el ajo  y el rbano picante. Cereales Pasteles o panes sin levadura con grasa agregada. Carnes y [address] protenas [address] de alto contenido graso como carne grasa de vaca o cerdo, salchichas, costillas, jamn, salchicha, salame y tocino. Carnes o protenas fritas, como el pescado frito y el pollo frito. Yemas de huevo. Grasas y [employer]. Margarina. Lardo. [place] clarificada. Bebidas Caf y lavella bebidas con cafena. Bebidas gaseosas y [address], como los refrescos y las bebidas energizantes. Jugo de fruta hecho con frutas cidas, como naranja o pomelo. Jugo de tomate. Dulces y postres Chocolate y cacao. Rosquillas. Alios y condimentos Menta, como la menta piperita y la hierbabuena. Condimentos, hierbas o aderezos que ocasionen sntomas. Estos pueden incluir el curry, la salsa picante o los aderezos para ensalada a base de vinagre. Es posible que los productos que se enumeran ms arriba no sean todos los alimentos y las bebidas que Personnel officer. Consulte a su nutricionista para obtener ms informacin. Preguntas para hacerle al [employer] Los cambios en la dieta y en la vida cotidiana a menudo son los primeros pasos que se toman para Company secretary los sntomas de [place]. Si estos cambios no dan resultados, consulte al mdico si debe tomar [employer]. Dnde obtener ms informacin International Foundation for Gastrointestinal Disorders (Fundacin Internacional para los  Trastornos Gastrointestinales): aboutgerd.org Esta informacin no tiene Theme park manager el consejo del mdico. Asegrese de hacerle al mdico cualquier pregunta que tenga. Document Revised: 05/29/2023 Document Reviewed: 05/29/2023 Elsevier Patient Education  2024 Elsevier Inc.     Emil Schaumann, MD [facility]

## 2024-06-10 NOTE — Assessment & Plan Note (Signed)
 Vitamin D  levels repeated today

## 2024-06-10 NOTE — Assessment & Plan Note (Signed)
 Most likely secondary to heavy menstrual periods. Continue multivitamin with iron supplementation daily

## 2024-06-10 NOTE — Patient Instructions (Signed)
 ERGE en adultos: cambios en la dieta GERD in Adults: Diet Changes Cuando una persona tiene enfermedad de reflujo gastroesofgico (ERGE), es posible que deba hacer cambios en su dieta. Elegir los alimentos adecuados puede ayudar a [employer]. Considere recurrir a un experto en alimentacin saludable llamado nutricionista. Este puede ayudarlo a hacer elecciones alimentarias saludables. Consejos para seguir Surveyor, minerals Al leer las etiquetas de los alimentos Elija alimentos que tengan bajo contenido de grasas saturadas. Los alimentos que pueden ayudar con los sntomas incluyen los siguientes: Alimentos con menos del 5 % de los valores diarios (VD) de grasa. Alimentos con 0 gramos de grasas trans. Al cocinar Cocine los alimentos de formas que no requieran [place]. Estas formas incluyen las siguientes: Hornear. Cocer al vapor. Grillar. Hervir. Para agregar sabor, trate de consumir hierbas con bajo contenido de picante y [place]. Evite frer los alimentos. Planificacin de las comidas  Haga comidas pequeas durante Glass blower/designer de hacer 3 comidas abundantes. Coma lentamente y en un lugar donde se sienta relajado. Si se lo indic el mdico, evite: Consumir alimentos que le ocasionen sntomas. Lleve un registro de los alimentos para identificar aquellos que le causen sntomas. Alcohol. Beber mucha cantidad de lquido con las comidas. Instrucciones generales Durante 2 a 3 horas despus de comer, evite: Agacharse. Realizar actividad fsica. Acostarse. Masticar chicle sin azcar despus de las comidas. Qu alimentos debo comer? Siga una dieta saludable. Trate de incluir: Alimentos con gran cantidad de [place]. Esto incluye lo siguiente: [place] y verduras. Cereales integrales y legumbres. Productos lcteos con bajo contenido de [place]. Vita [place], pescado y aves. Claras de huevo. Los alimentos que causan sntomas en otra persona pueden no causarle sntomas a usted. Trabaje con  el mdico para hallar alimentos que sean seguros para usted. Es posible que los productos que se enumeran ms arriba no sean todos los alimentos y las bebidas que puede consumir. Consulte a su nutricionista para obtener ms informacin. Es posible que los productos detallados arriba no constituyan una lista completa de los alimentos y las bebidas que puede tomar. Consulte a un nutricionista para obtener ms informacin. Qu alimentos debo evitar? Limitar algunos de estos alimentos puede ayudar a [employer]. Cada persona es diferente. Hable con un nutricionista o con su mdico para que lo ayude a Photographer. Algunos de los [employer] evitar pueden incluir: Frutas Frutas que sean muy cidas. Estas pueden ser las frutas ctricas como la naranja, el pomelo, la pia y el limn. Verduras Verduras fritas, como las papas fritas. Verduras, salsas o aderezos elaborados con grasa agregada y verduras cidas. Estos pueden [employer] y los productos con tomate, el aj, la [place], el ajo y el rbano picante. Cereales Pasteles o panes sin levadura con grasa agregada. Carnes y [address] protenas [address] de alto contenido graso como carne grasa de vaca o cerdo, salchichas, costillas, jamn, salchicha, salame y tocino. Carnes o protenas fritas, como el pescado frito y el pollo frito. Yemas de huevo. Grasas y [employer]. Margarina. Lardo. [place] clarificada. Bebidas Caf y [employer] bebidas con cafena. Bebidas gaseosas y [address], como los refrescos y las bebidas energizantes. Jugo de fruta hecho con frutas cidas, como naranja o pomelo. Jugo de tomate. Dulces y postres Chocolate y cacao. [place]quillas. Alios y condimentos Menta, como la menta piperita y la hierbabuena. Condimentos, hierbas o aderezos que ocasionen sntomas. Estos pueden incluir el curry, la salsa picante o los aderezos para ensalada a base  de vinagre. Es posible que los productos  que se enumeran ms arriba no sean todos los alimentos y las bebidas que Personnel officer. Consulte a su nutricionista para obtener ms informacin. Preguntas para hacerle al [employer] Los cambios en la dieta y en la vida cotidiana a menudo son los primeros pasos que se toman para Company secretary los sntomas de [place]. Si estos cambios no dan resultados, consulte al mdico si debe tomar [employer]. Dnde obtener ms informacin Arts development officer for Gastrointestinal Disorders (Fundacin Internacional para los Trastornos Gastrointestinales): aboutgerd.org Esta informacin no tiene Theme park manager el consejo del mdico. Asegrese de hacerle al mdico cualquier pregunta que tenga. Document Revised: 05/29/2023 Document Reviewed: 05/29/2023 Elsevier Patient Education  2024 [employer].

## 2024-06-10 NOTE — Assessment & Plan Note (Signed)
 Having symptoms of reflux including sore throat, intermittent cough, epigastric burning sensation worse at nighttime Recommend trial of pantoprazole  40 mg daily for 4 to 8 weeks If no improvement, recommend upper endoscopy Diet and nutrition discussed

## 2024-06-11 ENCOUNTER — Ambulatory Visit: Payer: Self-pay | Admitting: Emergency Medicine

## 2024-06-11 LAB — IRON,TIBC AND FERRITIN PANEL
%SAT: 13 % — ABNORMAL LOW (ref 16–45)
Ferritin: 25 ng/mL (ref 16–232)
Iron: 57 ug/dL (ref 40–190)
TIBC: 424 ug/dL (ref 250–450)

## 2024-10-09 ENCOUNTER — Ambulatory Visit (INDEPENDENT_AMBULATORY_CARE_PROVIDER_SITE_OTHER): Admitting: Emergency Medicine

## 2024-10-09 ENCOUNTER — Encounter: Payer: Self-pay | Admitting: Emergency Medicine

## 2024-10-09 ENCOUNTER — Ambulatory Visit: Admitting: Emergency Medicine

## 2024-10-09 VITALS — BP 112/80 | HR 89 | Temp 98.1°F | Ht 62.5 in | Wt 179.0 lb

## 2024-10-09 DIAGNOSIS — S0990XA Unspecified injury of head, initial encounter: Secondary | ICD-10-CM | POA: Diagnosis not present

## 2024-10-09 NOTE — Progress Notes (Signed)
 Cassandra Coffey 43 y.o.   Chief Complaint  Patient presents with   Fall    Pt states that the pain is coming from the back of her head left side     HISTORY OF PRESENT ILLNESS: This is a 43 y.o. female sustained fall about 4 weeks ago and hit left side back of her head against a wall without loss of consciousness Slipped on a rug and fell backwards No syncope.  No seizure activity Had a headache for couple days.  Much improved today Denies neurological symptoms No other associated symptoms. No other complaints or medical concerns today.  Fall Pertinent negatives include no abdominal pain, fever, headaches, nausea or vomiting.     Prior to Admission medications   Medication Sig Start Date End Date Taking? Authorizing Provider  azithromycin  (ZITHROMAX ) 250 MG tablet Sig as indicated 06/10/24  Yes Quinnten Calvin, Emil Schanz, MD  pantoprazole  (PROTONIX ) 40 MG tablet Take 1 tablet (40 mg total) by mouth daily. 06/10/24  Yes Valia Wingard, Emil Schanz, MD  Vitamin D , Ergocalciferol , (DRISDOL ) 1.25 MG (50000 UNIT) CAPS capsule Take 1 capsule (50,000 Units total) by mouth every 7 (seven) days. 08/13/23  Yes [place]cell Emil Schanz, MD    No Known Allergies  Patient Active Problem List   Diagnosis Date Noted   Epigastric burning sensation 06/10/2024   Gastroesophageal reflux disease with esophagitis without hemorrhage 06/10/2024   Suspected sleep apnea 06/10/2024   Subacute cough 06/10/2024   Vitamin D  deficiency 08/13/2023   Dyslipidemia 09/19/2019   Iron deficiency anemia 09/19/2019   Elevated cholesterol 09/27/2017    Past Medical History:  Diagnosis Date   Anemia     History reviewed. No pertinent surgical history.  Social History   Socioeconomic History   Marital status: Married    Spouse name: Not on file   Number of children: Not on file   Years of education: Not on file   Highest education level: Not on file  Occupational History   Not on file  Tobacco Use    Smoking status: Never   Smokeless tobacco: Never  Vaping Use   Vaping status: Never Used  Substance and Sexual Activity   Alcohol use: No    Alcohol/week: 0.0 standard drinks of alcohol   Drug use: No   Sexual activity: Yes    Partners: Male    Birth control/protection: None, Rhythm  Other Topics Concern   Not on file  Social History Narrative   Not on file   Social Drivers of Health   Financial Resource Strain: Not on file  Food Insecurity: Not on file  Transportation Needs: Not on file  Physical Activity: Not on file  Stress: Not on file  Social Connections: Not on file  Intimate Partner Violence: Not on file    Family History  Problem Relation Age of Onset   Cancer Maternal Aunt        OVARIAN   Breast cancer Maternal Aunt      Review of Systems  Constitutional: Negative.  Negative for chills and fever.  HENT: Negative.  Negative for congestion and sore throat.   Respiratory: Negative.  Negative for cough and shortness of breath.   Cardiovascular: Negative.  Negative for chest pain and palpitations.  Gastrointestinal:  Negative for abdominal pain, diarrhea, nausea and vomiting.  Skin: Negative.  Negative for rash.  Neurological: Negative.  Negative for dizziness, sensory change, speech change, focal weakness and headaches.  All other systems reviewed and are negative.   Vitals:  10/09/24 1309  BP: 112/80  Pulse: 89  Temp: 98.1 F (36.7 C)  SpO2: 98%    Physical Exam Vitals reviewed.  Constitutional:      Appearance: Normal appearance.  HENT:     Head: Normocephalic.     Right Ear: Tympanic membrane, ear canal and external ear normal.     Left Ear: Tympanic membrane, ear canal and external ear normal.     Mouth/Throat:     Mouth: Mucous membranes are moist.     Pharynx: Oropharynx is clear.  Eyes:     Extraocular Movements: Extraocular movements intact.     Pupils: Pupils are equal, round, and reactive to light.  Cardiovascular:     Rate and  Rhythm: Normal rate and regular rhythm.     Pulses: Normal pulses.     Heart sounds: Normal heart sounds.  Pulmonary:     Effort: Pulmonary effort is normal.     Breath sounds: Normal breath sounds.  Musculoskeletal:     Cervical back: No tenderness.  Lymphadenopathy:     Cervical: No cervical adenopathy.  Skin:    General: Skin is warm and dry.  Neurological:     General: No focal deficit present.     Mental Status: She is alert and oriented to person, place, and time.     Cranial Nerves: No cranial nerve deficit.     Sensory: No sensory deficit.     Motor: No weakness.     Coordination: Coordination normal.     Gait: Gait normal.     Deep Tendon Reflexes: Reflexes normal.  Psychiatric:        Mood and Affect: Mood normal.      ASSESSMENT & PLAN: I personally spent a total of 30 minutes minutes in the care of the patient today including preparing to see the patient, getting/reviewing separately obtained history, performing a medically appropriate exam/evaluation, counseling and educating, documenting clinical information in the EHR, coordinating care, and prognosis, ED precautions, and need for follow-up with clinical picture changes or worsens over the next couple of weeks..  Problem List Items Addressed This Visit       Other   Head injury - Primary   Clinically stable.  No red flag signs or symptoms. Injury happened 4 weeks ago. Slowly got better. Much improved today. Benign neurological examination. No concerns identified today.      Patient Instructions  Lesin en la cabeza en los adultos Head Injury, Adult Hay muchos tipos de lesiones en la cabeza. Algunas pueden ser tan leves como un chichn pequeo. Otras pueden ser ms graves. Ejemplos de lesiones graves: Un golpe fuerte en la cabeza que sacude el cerebro hacia delante y atrs (conmocin cerebral). Un moretn (contusin) en el cerebro. Esto significa que hay hemorragia en el cerebro que puede causar  hinchazn. Fisura en el crneo (fractura de crneo). Hemorragia en el cerebro que se acumula, se espesa (se produce un cogulo) y forma un bulto (hematoma). La mayora de los problemas provocados por una lesin en la cabeza ocurren durante las primeras 24 horas. Sin embargo, es posible que siga teniendo efectos secundarios entre 7 y [address] despus de la lesin. Es importante controlar su afeccin para ver si hay cambios. Es posible que deban observarlo en el departamento de emergencias o en el servicio de atencin urgente, o puede ser necesario que se quede en el hospital. [place] son las causas? Hay muchas causas posibles de una lesin en la cabeza. Una lesin en  la cabeza puede tener estas causas: Un accidente automovilstico. Accidentes en bicicleta o motocicleta. Lesiones deportivas. Cadas. Ser golpeado por un objeto. Cules son los signos o sntomas? Los sntomas de lesin en la cabeza incluyen un moretn, un chichn o un sangrado en el lugar de la lesin. Otros sntomas fsicos pueden ser: Dolor de cabeza. Sensacin de que va a vomitar (nuseas) o vomitar. Mareos. Visin borrosa o doble. Sentir incomodidad cerca de luces brillantes o ruidos fuertes. Cansancio. Dificultad para despertarse. Sntomas graves, como: Sentir debilidad o adormecimiento en un lado del cuerpo. Dificultad para hablar. Dificultades para tragar. Desmayos. Temblores que no puede controlar (convulsiones). Los sntomas mentales o emocionales pueden incluir los siguientes: Sentirse abrumado o malhumorado. Confusin y [address] de [place]. Tener problemas para prestar atencin o concentrarse. Cambios en los hbitos de alimentacin o en el sueo. Sentirse preocupado o nervioso (ansioso). Sentirse triste (deprimido). Cmo se trata? El tratamiento de esta afeccin depende de la severidad de la lesin y del tipo de lesin que sufri. El objetivo principal es prevenir problemas y darle tiempo al cerebro para que  se recupere. Lesin de cabeza leve Si usted sufre una lesin de cabeza leve, es posible que lo enven a casa, y el tratamiento puede incluir lo siguiente: Estar en observacin. Un adulto responsable debe quedarse con usted durante 24 horas despus de producida la lesin y controlarlo con frecuencia. Descanso fsico. Descanso cerebral. Analgsicos. Lesin muy severa en la cabeza Si tiene una lesin muy severa en la cabeza, el tratamiento puede incluir lo siguiente: Estar en observacin cercana. Esto engineer, manufacturing hospital. Medicamentos para: Ayudar a engineer, materials. Evitar las convulsiones. Ayudar con la hinchazn del cerebro. Proteger las vas respiratorias y usar una mquina que lo ayude a industrial/product designer (respirador). Observar y tratar la hinchazn dentro del cerebro. Ciruga de cerebro. Esta puede ser necesaria en los siguientes casos: Extraer una acumulacin de sangre o cogulos de sangre. Interrumpir el sangrado. Retirar una parte del crneo. Esto permite que el cerebro tenga lugar para hincharse. Siga estas instrucciones en su casa: Actividad Descanse. Evite las actividades difciles o cansadoras. Asegrese de dormir lo suficiente. Deje que el cerebro descanse. Haga menos actividades que requieran pensar mucho o prestar mucha atencin, como las siguientes: Mirar televisin. Jugar juegos de memoria y armar rompecabezas. Tareas para el hogar o trabajos relacionados con el empleo. Trabajar en la computadora, participar en redes sociales y enviar mensajes de texto. Evite las [employer] utilities que pudieran provocar otra lesin en la cabeza hasta que el mdico lo autorice. Entre ellas, practicar deportes. Pregunte al mdico cundo puede regresar a las actividades normales sin riesgo, como al [place] o a la escuela. Pregntele al mdico cundo puede conducir, andar en bicicleta o usar mquinas. No realice estas actividades si se siente mareado. Estilo de vida  No beba alcohol hasta  que el mdico lo autorice. No consuma drogas. Si le resulta difcil recordar las cosas, escrbalas. Trate de hacer una cosa por vez si se distrae con facilidad. Consulte con familiares y amigos si debe tomar decisiones importantes. Cunteles a sus amigos, familiares, colegas de confianza y su gerente en el trabajo sobre su lesin, los sntomas y sus lmites (restricciones). Pdales que observen si aparecen nuevos problemas o empeoran los existentes. Instrucciones generales Use los medicamentos de venta libre y los recetados solamente como se lo haya indicado el mdico. Haga que un adulto responsable permanezca con usted durante 24 horas despus de la lesin en la cabeza. Esta persona debe  observar si hay cambios en los sntomas y estar preparada para obtener ayuda. Concurra a todas las visitas de seguimiento para que cualquier problema que tenga se pueda encontrar de forma temprana. Cmo se previene? Tener otra lesin en la cabeza puede ser peligroso. Una nueva lesin puede causar daos cerebrales, hinchazn del cerebro o la muerte. Puede evitarlo de las siguientes maneras: Printmaker en su equilibrio y su fuerza. Esto puede ayudarlo a evitar cadas. Usar el cinturn de seguridad cuando se encuentre en un vehculo en movimiento. Usar un casco cuando realice las siguientes actividades: Andar en bicicleta. Esquiar. Practicar algn otro deporte o actividad que representen un riesgo de lesin. Haga de su hogar un lugar ms seguro: Deshacerse de los obstculos en pisos y music therapist. Esto incluye las cosas que pueden hacer que se tropiece. Coloque barras para sostn en los baos y investment banker, operational en las escaleras. Ponga alfombras antideslizantes en pisos y baeras. Mejore la iluminacin de las zonas oscuras. Dnde obtener ms informacin Brain Injury Association (Asociacin de Lesiones Cerebrales): biausa.org Comunquese con un mdico si: Estos sntomas no desaparecen: Dolores de  cabeza. Mareos. Visin doble o cambios en la visin. Dificultad para dormir. Cambios de nimo. Aparecen nuevos sntomas. Solicite ayuda de inmediato si: Repentinamente, tiene los siguientes sntomas: Dolor de occupational psychologist. Vmitos persistentes. Cambios en el tamao de una de las pupilas. Las pupilas son las partes centrales negras de los ojos. Cambios en la forma de ver (visin). Ms confusin o est ms malhumorado. Tiene una convulsin. Sus sntomas empeoran. Presenta una secrecin clara o con sangre que proviene de la nariz o de los odos. Estos sntomas pueden customer service manager. Solicite ayuda de inmediato. Llame al 911. No espere a ver si los sntomas desaparecen. No conduzca por sus propios medios [employer] incorporated. Esta informacin no tiene theme park manager el consejo del mdico. Asegrese de hacerle al mdico cualquier pregunta que tenga. Document Revised: 12/23/2022 Document Reviewed: 12/23/2022 Elsevier Patient Education  2024 Elsevier Inc.    Emil Schaumann, MD [facility]

## 2024-10-09 NOTE — Assessment & Plan Note (Signed)
 Clinically stable.  No red flag signs or symptoms. Injury happened 4 weeks ago. Slowly got better. Much improved today. Benign neurological examination. No concerns identified today.

## 2024-10-09 NOTE — Patient Instructions (Signed)
 Lesin en la cabeza en los adultos Head Injury, Adult Hay muchos tipos de lesiones en la cabeza. Algunas pueden ser tan leves como un chichn pequeo. Otras pueden ser ms graves. Ejemplos de lesiones graves: Un golpe fuerte en la cabeza que sacude el cerebro hacia delante y atrs (conmocin cerebral). Un moretn (contusin) en el cerebro. Esto significa que hay hemorragia en el cerebro que puede causar hinchazn. Fisura en el crneo (fractura de crneo). Hemorragia en el cerebro que se acumula, se espesa (se produce un cogulo) y forma un bulto (hematoma). La mayora de los problemas provocados por una lesin en la cabeza ocurren durante las primeras 24 horas. Sin embargo, es posible que siga teniendo efectos secundarios entre 7 y [address] despus de la lesin. Es importante controlar su afeccin para ver si hay cambios. Es posible que deban observarlo en el departamento de emergencias o en el servicio de atencin urgente, o puede ser necesario que se quede en el hospital. [place] son las causas? Hay muchas causas posibles de una lesin en la cabeza. Una lesin en la cabeza puede tener estas causas: Un accidente automovilstico. Accidentes en bicicleta o motocicleta. Lesiones deportivas. Cadas. Ser golpeado por un objeto. Cules son los signos o sntomas? Los sntomas de lesin en la cabeza incluyen un moretn, un chichn o un sangrado en el lugar de la lesin. Otros sntomas fsicos pueden ser: Dolor de cabeza. Sensacin de que va a vomitar (nuseas) o vomitar. Mareos. Visin borrosa o doble. Sentir incomodidad cerca de luces brillantes o ruidos fuertes. Cansancio. Dificultad para despertarse. Sntomas graves, como: Sentir debilidad o adormecimiento en un lado del cuerpo. Dificultad para hablar. Dificultades para tragar. Desmayos. Temblores que no puede controlar (convulsiones). Los sntomas mentales o emocionales pueden incluir los siguientes: Sentirse abrumado o  malhumorado. Confusin y [address] de [place]. Tener problemas para prestar atencin o concentrarse. Cambios en los hbitos de alimentacin o en el sueo. Sentirse preocupado o nervioso (ansioso). Sentirse triste (deprimido). Cmo se trata? El tratamiento de esta afeccin depende de la severidad de la lesin y del tipo de lesin que sufri. El objetivo principal es prevenir problemas y darle tiempo al cerebro para que se recupere. Lesin de cabeza leve Si usted sufre una lesin de cabeza leve, es posible que lo enven a casa, y el tratamiento puede incluir lo siguiente: Estar en observacin. Un adulto responsable debe quedarse con usted durante 24 horas despus de producida la lesin y controlarlo con frecuencia. Descanso fsico. Descanso cerebral. Analgsicos. Lesin muy severa en la cabeza Si tiene una lesin muy severa en la cabeza, el tratamiento puede incluir lo siguiente: Estar en observacin cercana. Esto Engineer, manufacturing hospital. Medicamentos para: Ayudar a Engineer, materials. Evitar las convulsiones. Ayudar con la hinchazn del cerebro. Proteger las vas respiratorias y usar una mquina que lo ayude a Industrial/product designer (respirador). Observar y tratar la hinchazn dentro del cerebro. Ciruga de cerebro. Esta puede ser necesaria en los siguientes casos: Extraer una acumulacin de sangre o cogulos de sangre. Interrumpir el sangrado. Retirar una parte del crneo. Esto permite que el cerebro tenga lugar para hincharse. Siga estas instrucciones en su casa: Actividad Descanse. Evite las actividades difciles o cansadoras. Asegrese de dormir lo suficiente. Deje que el cerebro descanse. Haga menos actividades que requieran pensar mucho o prestar mucha atencin, como las siguientes: Mirar televisin. Jugar juegos de memoria y armar rompecabezas. Tareas para el hogar o trabajos relacionados con el empleo. Trabajar en la computadora, participar en redes sociales y  enviar mensajes de  texto. Evite las [employer] que pudieran provocar otra lesin en la cabeza hasta que el mdico lo autorice. Entre ellas, practicar deportes. Pregunte al mdico cundo puede regresar a las actividades normales sin riesgo, como al [place] o a la escuela. Pregntele al mdico cundo puede conducir, andar en bicicleta o usar mquinas. No realice estas actividades si se siente mareado. Estilo de vida  No beba alcohol hasta que el mdico lo autorice. No consuma drogas. Si le resulta difcil recordar las cosas, escrbalas. Trate de hacer una cosa por vez si se distrae con facilidad. Consulte con familiares y amigos si debe tomar decisiones importantes. Cunteles a sus amigos, familiares, colegas de confianza y su gerente en el trabajo sobre su lesin, los sntomas y sus lmites (restricciones). Pdales que observen si aparecen nuevos problemas o empeoran los existentes. Instrucciones generales Use los medicamentos de venta libre y los recetados solamente como se lo haya indicado el mdico. Haga que un adulto responsable permanezca con usted durante 24 horas despus de la lesin en la cabeza. Esta persona debe observar si hay cambios en los sntomas y estar preparada para [place]. Concurra a todas las visitas de seguimiento para que cualquier problema que tenga se pueda encontrar de forma temprana. Cmo se previene? Tener otra lesin en la cabeza puede ser peligroso. Una nueva lesin puede causar daos cerebrales, hinchazn del cerebro o la muerte. Puede evitarlo de las siguientes maneras: Printmaker en su equilibrio y su fuerza. Esto puede ayudarlo a evitar cadas. Usar el cinturn de seguridad cuando se encuentre en un vehculo en movimiento. Usar un casco cuando realice las siguientes actividades: Andar en bicicleta. Esquiar. Practicar algn otro deporte o actividad que representen un riesgo de lesin. Haga de su hogar un lugar ms seguro: Deshacerse de los obstculos en pisos y Music therapist.  Esto incluye las cosas que pueden hacer que se tropiece. Coloque barras para sostn en los baos y Investment banker, operational en las escaleras. Ponga alfombras antideslizantes en pisos y baeras. Mejore la iluminacin de las zonas oscuras. Dnde obtener ms informacin Brain Injury Association (Asociacin de Lesiones Cerebrales): biausa.org Comunquese con un mdico si: Estos sntomas no desaparecen: Dolores de cabeza. Mareos. Visin doble o cambios en la visin. Dificultad para dormir. Cambios de nimo. Aparecen nuevos sntomas. Solicite ayuda de inmediato si: Repentinamente, tiene los siguientes sntomas: Dolor de Occupational psychologist. Vmitos persistentes. Cambios en el tamao de una de las pupilas. Las pupilas son las partes centrales negras de los ojos. Cambios en la forma de ver (visin). Ms confusin o est ms malhumorado. Tiene una convulsin. Sus sntomas empeoran. Presenta una secrecin clara o con sangre que proviene de la nariz o de los odos. Estos sntomas pueden Customer service manager. Solicite ayuda de inmediato. Llame al 911. No espere a ver si los sntomas desaparecen. No conduzca por sus propios medios [employer]. Esta informacin no tiene Theme park manager el consejo del mdico. Asegrese de hacerle al mdico cualquier pregunta que tenga. Document Revised: 12/23/2022 Document Reviewed: 12/23/2022 Elsevier Patient Education  2024 [employer].

## 2024-12-23 ENCOUNTER — Encounter: Admitting: Obstetrics & Gynecology

## 2024-12-25 ENCOUNTER — Telehealth: Payer: Self-pay | Admitting: *Deleted

## 2024-12-25 NOTE — Telephone Encounter (Signed)
 Line busy

## 2025-02-03 ENCOUNTER — Encounter: Admitting: Obstetrics & Gynecology
